# Patient Record
Sex: Female | Born: 1990 | Race: Black or African American | Hispanic: No | Marital: Single | Smoking: Never smoker
Health system: Southern US, Community
[De-identification: ages and names within clinical notes are randomized; demographics above are authoritative.]

## PROBLEM LIST (undated history)

## (undated) ENCOUNTER — Inpatient Hospital Stay (HOSPITAL_COMMUNITY): Payer: Self-pay

## (undated) DIAGNOSIS — F419 Anxiety disorder, unspecified: Secondary | ICD-10-CM

## (undated) DIAGNOSIS — Z789 Other specified health status: Secondary | ICD-10-CM

## (undated) DIAGNOSIS — J302 Other seasonal allergic rhinitis: Secondary | ICD-10-CM

## (undated) HISTORY — PX: NO PAST SURGERIES: SHX2092

## (undated) HISTORY — PX: CLEFT PALATE REPAIR: SUR1165

---

## 2013-08-10 ENCOUNTER — Emergency Department (INDEPENDENT_AMBULATORY_CARE_PROVIDER_SITE_OTHER)
Admission: EM | Admit: 2013-08-10 | Discharge: 2013-08-10 | Disposition: A | Discharge status: Home / Self Care | Payer: Self-pay | Source: Home / Self Care

## 2013-08-10 ENCOUNTER — Encounter (HOSPITAL_COMMUNITY): Payer: Self-pay | Admitting: Emergency Medicine

## 2013-08-10 DIAGNOSIS — R110 Nausea: Secondary | ICD-10-CM

## 2013-08-10 LAB — POCT PREGNANCY, URINE: PREG TEST UR: NEGATIVE

## 2013-08-10 MED ORDER — ONDANSETRON HCL 4 MG PO TABS: 4.0000 mg | ORAL_TABLET | Freq: Three times a day (TID) | ORAL | Status: DC | PRN

## 2013-08-10 MED ORDER — OMEPRAZOLE 20 MG PO CPDR: 20.0000 mg | DELAYED_RELEASE_CAPSULE | Freq: Every day | ORAL | Status: DC

## 2013-08-10 NOTE — ED Provider Notes (Signed)
CSN: [id number]     Arrival date & time 08/10/13  1915 History   First MD Initiated Contact with Patient 08/10/13 2111     Chief Complaint  Patient presents with  . Nausea     (Consider location/radiation/quality/duration/timing/severity/associated sxs/prior Treatment) HPI Comments: 23 year old female presents with nausea for approximately 2 months. It occurs nearly on a daily basis. She also has some associated belching. She is contributing to his symptoms to the Implanon for which he had implanted in 2012. Shortly after this implantation she had mild transient nausea however in December of 2013 it has worsened. She denies associated fever, vomiting or abdominal pain. She's been having normal bowel movements. Has not seen any bleeding.   History reviewed. No pertinent past medical history. History reviewed. No pertinent past surgical history. History reviewed. No pertinent family history. History  Substance Use Topics  . Smoking status: Never Smoker   . Smokeless tobacco: Not on file  . Alcohol Use: Yes     Comment: occasional   OB History   Grav Para Term Preterm Abortions TAB SAB Ect Mult Living                 Review of Systems  Constitutional: Positive for appetite change. Negative for fever, fatigue and unexpected weight change.  HENT: Negative.   Respiratory: Negative.   Cardiovascular: Negative.   Gastrointestinal: Positive for nausea. Negative for vomiting and abdominal pain.  Genitourinary: Negative.  Negative for urgency, vaginal discharge, menstrual problem and pelvic pain.  Musculoskeletal: Negative.   Neurological: Negative.       Allergies  Review of patient's allergies indicates no known allergies.  Home Medications   Current Outpatient Rx  Name  Route  Sig  Dispense  Refill  . etonogestrel (IMPLANON) 68 MG IMPL implant   Subcutaneous   Inject 1 each into the skin once.         Marland Kitchen. omeprazole (PRILOSEC) 20 MG capsule   Oral   Take 1 capsule (20 mg  total) by mouth daily.   30 capsule   0   . ondansetron (ZOFRAN) 4 MG tablet   Oral   Take 1 tablet (4 mg total) by mouth every 8 (eight) hours as needed for nausea. May cause constipation   20 tablet   0    BP 104/59  Pulse 84  Temp(Src) 99 F (37.2 C) (Oral)  Resp 14  SpO2 100%  LMP 08/05/2013 Physical Exam  Nursing note and vitals reviewed. Constitutional: She is oriented to person, place, and time. She appears well-developed and well-nourished. No distress.  Neck: Normal range of motion.  Cardiovascular: Normal rate and regular rhythm.   Pulmonary/Chest: Effort normal and breath sounds normal. No respiratory distress. She has no wheezes.  Abdominal: Soft. Bowel sounds are normal. She exhibits no distension. There is no tenderness. There is no rebound and no guarding.  Musculoskeletal: She exhibits no edema.  Neurological: She is alert and oriented to person, place, and time. She exhibits normal muscle tone.  Skin: Skin is warm and dry.  Psychiatric: She has a normal mood and affect.    ED Course  Procedures (including critical care time) Labs Review Labs Reviewed  POCT PREGNANCY, URINE   Imaging Review No results found.    Results for orders placed during the hospital encounter of 08/10/13  POCT PREGNANCY, URINE      Result Value Range   Preg Test, Ur NEGATIVE  NEGATIVE     MDM   Final  diagnoses:  Chronic nausea    Nausea with uncertain etiology. It is possible that she may have nausea due to the Implanon however more likely due to acid reflux or other etiology We'll treat with omeprazole 20 mg daily and Zofran every 8 hours when necessary nausea or vomiting Stop eating any greasy salty meals, no spicy or acidic foods. Eat small amounts more frequently than large meals. In having bleeding, vomiting, unable to hold liquids today on or other problems will need to see a gastroenterologist.    Hayden Rasmussen, NP 08/10/13 2209

## 2013-08-10 NOTE — ED Notes (Signed)
C/o nausea off and on since December.  She is on Implanon since 2012.  She wakes up at night with nausea and after eating.  The smell of chicken makes it worse.  No vomiting.

## 2013-08-10 NOTE — Discharge Instructions (Signed)
Nausea, Adult Nausea is the feeling that you have an upset stomach or have to vomit. Nausea by itself is not likely a serious concern, but it may be an early sign of more serious medical problems. As nausea gets worse, it can lead to vomiting. If vomiting develops, there is the risk of dehydration.  CAUSES   Viral infections.  Food poisoning.  Medicines.  Pregnancy.  Motion sickness.  Migraine headaches.  Emotional distress.  Severe pain from any source.  Alcohol intoxication. HOME CARE INSTRUCTIONS  Get plenty of rest.  Ask your caregiver about specific rehydration instructions.  Eat small amounts of food and sip liquids more often.  Take all medicines as told by your caregiver. SEEK MEDICAL CARE IF:  You have not improved after 2 days, or you get worse.  You have a headache. SEEK IMMEDIATE MEDICAL CARE IF:   You have a fever.  You faint.  You keep vomiting or have blood in your vomit.  You are extremely weak or dehydrated.  You have dark or bloody stools.  You have severe chest or abdominal pain. MAKE SURE YOU:  Understand these instructions.  Will watch your condition.  Will get help right away if you are not doing well or get worse. Document Released: 07/26/2004 Document Revised: 03/12/2012 Document Reviewed: 02/28/2011 Exit[employer] Patient Information 2014 [employer].  

## 2013-08-12 NOTE — ED Provider Notes (Signed)
Medical screening examination/treatment/procedure(s) were performed by resident physician or non-physician practitioner and as supervising physician I was immediately available for consultation/collaboration.   Morris Longenecker DOUGLAS MD.   Chadwick Reiswig D Mele Sylvester, MD 08/12/13 1957 

## 2014-04-15 ENCOUNTER — Encounter (HOSPITAL_COMMUNITY): Payer: Self-pay | Admitting: Emergency Medicine

## 2014-04-15 ENCOUNTER — Emergency Department (INDEPENDENT_AMBULATORY_CARE_PROVIDER_SITE_OTHER)
Admission: EM | Admit: 2014-04-15 | Discharge: 2014-04-15 | Disposition: A | Discharge status: Home / Self Care | Payer: Self-pay | Source: Home / Self Care | Attending: Family Medicine | Admitting: Family Medicine

## 2014-04-15 DIAGNOSIS — J029 Acute pharyngitis, unspecified: Secondary | ICD-10-CM

## 2014-04-15 DIAGNOSIS — H6500 Acute serous otitis media, unspecified ear: Secondary | ICD-10-CM

## 2014-04-15 MED ORDER — FLUTICASONE PROPIONATE 50 MCG/ACT NA SUSP: 2.0000 | Freq: Two times a day (BID) | NASAL | Status: DC

## 2014-04-15 MED ORDER — PREDNISONE 10 MG PO TABS: ORAL_TABLET | ORAL | Status: DC

## 2014-04-15 NOTE — Discharge Instructions (Signed)
Use Advil sinus as needed for symptoms   Serous Otitis Media Serous otitis media is fluid in the middle ear space. This space contains the bones for hearing and air. Air in the middle ear space helps to transmit sound.  The air gets there through the eustachian tube. This tube goes from the back of the nose (nasopharynx) to the middle ear space. It keeps the pressure in the middle ear the same as the outside world. It also helps to drain fluid from the middle ear space. CAUSES  Serous otitis media occurs when the eustachian tube gets blocked. Blockage can come from:  Ear infections.  Colds and other upper respiratory infections.  Allergies.  Irritants such as cigarette smoke.  Sudden changes in air pressure (such as descending in an airplane).  Enlarged adenoids.  A mass in the nasopharynx. During colds and upper respiratory infections, the middle ear space can become temporarily filled with fluid. This can happen after an ear infection also. Once the infection clears, the fluid will generally drain out of the ear through the eustachian tube. If it does not, then serous otitis media occurs. SIGNS AND SYMPTOMS   Hearing loss.  A feeling of fullness in the ear, without pain.  Young children may not show any symptoms but may show slight behavioral changes, such as agitation, ear pulling, or crying. DIAGNOSIS  Serous otitis media is diagnosed by an ear exam. Tests may be done to check on the movement of the eardrum. Hearing exams may also be done. TREATMENT  The fluid most often goes away without treatment. If allergy is the cause, allergy treatment may be helpful. Fluid that persists for several months may require minor surgery. A small tube is placed in the eardrum to:  Drain the fluid.  Restore the air in the middle ear space. In certain situations, antibiotic medicines are used to avoid surgery. Surgery may be done to remove enlarged adenoids (if this is the cause). HOME CARE  INSTRUCTIONS   Keep children away from tobacco smoke.  Keep all follow-up visits as directed by your health care provider. SEEK MEDICAL CARE IF:   Your hearing is not better in 3 months.  Your hearing is worse.  You have ear pain.  You have drainage from the ear.  You have dizziness.  You have serous otitis media only in one ear or have any bleeding from your nose (epistaxis).  You notice a lump on your neck. MAKE SURE YOU:  Understand these instructions.   Will watch your condition.   Will get help right away if you are not doing well or get worse.  Document Released: 09/08/2003 Document Revised: 11/02/2013 Document Reviewed: 01/13/2013 [facility] Patient Information 2015 [employer], [place]. This information is not intended to replace advice given to you by your health care provider. Make sure you discuss any questions you have with your health care provider.  I and

## 2014-04-15 NOTE — ED Notes (Signed)
Pt  Reports  Symptoms  Of  sorethroat         r  Earache           X  sev  Days          Reports  Had  Some  Chills  Earlier  As  Well

## 2014-04-15 NOTE — ED Provider Notes (Signed)
CSN: [id number]     Arrival date & time 04/15/14  1048 History   First MD Initiated Contact with Patient 04/15/14 1107     Chief Complaint  Patient presents with  . Sore Throat   (Consider location/radiation/quality/duration/timing/severity/associated sxs/prior Treatment) Patient is a 23 y.o. female presenting with pharyngitis.  Sore Throat      23 year old female presents complaining of 2 days of right ear pain and right-sided throat pain. When this first began she had mild subjective fever and body aches but that has resolved, she is just having ear pain and sore throat now. She has gargled saltwater which was very helpful. He denies any further fever, chills, NVD, cough. Symptoms are rated as mild. No recent travel or sick contacts  No past medical history on file. No past surgical history on file. History reviewed. No pertinent family history. History  Substance Use Topics  . Smoking status: Never Smoker   . Smokeless tobacco: Not on file  . Alcohol Use: Yes     Comment: occasional   OB History   Grav Para Term Preterm Abortions TAB SAB Ect Mult Living                 Review of Systems  HENT: Positive for ear pain and sore throat.   All other systems reviewed and are negative.   Allergies  Review of patient's allergies indicates no known allergies.  Home Medications   Prior to Admission medications   Medication Sig Start Date End Date Taking? Authorizing Provider  etonogestrel (IMPLANON) 68 MG IMPL implant Inject 1 each into the skin once.    Historical Provider, MD  fluticasone (FLONASE) 50 MCG/ACT nasal spray Place 2 sprays into both nostrils 2 (two) times daily. Decrease to 2 sprays/nostril daily after 5 days 04/15/14   Graylon GoodZachary H Latonga Ponder, PA-C  omeprazole (PRILOSEC) 20 MG capsule Take 1 capsule (20 mg total) by mouth daily. 08/10/13   Hayden Rasmussenavid Mabe, NP  ondansetron (ZOFRAN) 4 MG tablet Take 1 tablet (4 mg total) by mouth every 8 (eight) hours as needed for nausea. May  cause constipation 08/10/13   Hayden Rasmussenavid Mabe, NP  predniSONE (DELTASONE) 10 MG tablet 4 tabs PO QD for 4 days; 3 tabs PO QD for 3 days; 2 tabs PO QD for 2 days; 1 tab PO QD for 1 day 04/15/14   Graylon GoodZachary H Derrick Orris, PA-C   BP 128/80  Pulse 85  Temp(Src) 99 F (37.2 C) (Oral)  Resp 18  SpO2 98%  LMP 04/01/2014 Physical Exam  Nursing note and vitals reviewed. Constitutional: She is oriented to person, place, and time. Vital signs are normal. She appears well-developed and well-nourished. No distress.  HENT:  Head: Normocephalic and atraumatic.  Right Ear: External ear and ear canal normal. A middle ear effusion (serous) is present.  Left Ear: Tympanic membrane, external ear and ear canal normal.  Mouth/Throat: Uvula is midline, oropharynx is clear and moist and mucous membranes are normal.  Eyes: Conjunctivae are normal.  Neck: Normal range of motion. Neck supple.  Pulmonary/Chest: Effort normal. No respiratory distress.  Neurological: She is alert and oriented to person, place, and time. She has normal strength. Coordination normal.  Skin: Skin is warm and dry. No rash noted. She is not diaphoretic.  Psychiatric: She has a normal mood and affect. Judgment normal.    ED Course  Procedures (including critical care time) Labs Review Labs Reviewed - No data to display  Imaging Review No results found.  MDM   1. Acute serous otitis media, recurrence not specified, unspecified laterality   2. Pharyngitis    Physical exam is essentially normal, mild serous otitis media. Most likely viral infection. Treat symptomatically. Followup when necessary     Meds ordered this encounter  Medications  . predniSONE (DELTASONE) 10 MG tablet    Sig: 4 tabs PO QD for 4 days; 3 tabs PO QD for 3 days; 2 tabs PO QD for 2 days; 1 tab PO QD for 1 day    Dispense:  30 tablet    Refill:  0    Order Specific Question:  Supervising Provider    Answer:  Lorenz CoasterKELLER, DAVID C [id number]]  . fluticasone (FLONASE) 50  MCG/ACT nasal spray    Sig: Place 2 sprays into both nostrils 2 (two) times daily. Decrease to 2 sprays/nostril daily after 5 days    Dispense:  16 g    Refill:  2    Order Specific Question:  Supervising Provider    Answer:  Lorenz CoasterKELLER, DAVID C [[id number]]     Graylon GoodZachary H Chadd Tollison, PA-C 04/15/14 [phone]

## 2014-05-01 NOTE — ED Provider Notes (Signed)
Medical screening examination/treatment/procedure(s) were performed by a resident physician or non-physician practitioner and as the supervising physician I was immediately available for consultation/collaboration.  Tianah Lonardo, MD Family Medicine   Alejah Aristizabal J Marrio Scribner, MD 05/01/14 0345 

## 2014-10-03 ENCOUNTER — Emergency Department (INDEPENDENT_AMBULATORY_CARE_PROVIDER_SITE_OTHER)
Admission: EM | Admit: 2014-10-03 | Discharge: 2014-10-03 | Disposition: A | Discharge status: Home / Self Care | Payer: Self-pay | Source: Home / Self Care | Attending: Emergency Medicine | Admitting: Emergency Medicine

## 2014-10-03 ENCOUNTER — Encounter (HOSPITAL_COMMUNITY): Payer: Self-pay | Admitting: Emergency Medicine

## 2014-10-03 DIAGNOSIS — J302 Other seasonal allergic rhinitis: Secondary | ICD-10-CM

## 2014-10-03 MED ORDER — CETIRIZINE HCL 10 MG PO TABS: 10.0000 mg | ORAL_TABLET | Freq: Every day | ORAL | Status: DC

## 2014-10-03 MED ORDER — FLUTICASONE PROPIONATE 50 MCG/ACT NA SUSP: 1.0000 | Freq: Every day | NASAL | Status: DC

## 2014-10-03 NOTE — Discharge Instructions (Signed)
You have allergies. Take Zyrtec 1 pill daily. Use Flonase daily. Apply Vaseline to your nostrils with a Q-tip at bedtime to prevent nosebleeds. Follow-up as needed.

## 2014-10-03 NOTE — ED Provider Notes (Signed)
CSN: [id number]     Arrival date & time 10/03/14  1426 History   First MD Initiated Contact with Patient 10/03/14 1519     Chief Complaint  Patient presents with  . Allergies   (Consider location/radiation/quality/duration/timing/severity/associated sxs/prior Treatment) HPI  She is a 24 year old woman here for seasonal allergies. She states her allergies have been bothering her for the last week or so. She reports itchy watery eyes, nasal congestion, sneezing, coughing, itchy ears, itchy throat. She has tried Benadryl and Visine drops without much improvement. No fevers or chills.  History reviewed. No pertinent past medical history. History reviewed. No pertinent past surgical history. History reviewed. No pertinent family history. History  Substance Use Topics  . Smoking status: Never Smoker   . Smokeless tobacco: Not on file  . Alcohol Use: Yes     Comment: occasional   OB History    No data available     Review of Systems  Constitutional: Negative for fever, chills and appetite change.  HENT: Positive for congestion, ear pain (itching), rhinorrhea, sneezing and sore throat (Itching).   Respiratory: Positive for cough.     Allergies  Review of patient's allergies indicates no known allergies.  Home Medications   Prior to Admission medications   Medication Sig Start Date End Date Taking? Authorizing Provider  cetirizine (ZYRTEC) 10 MG tablet Take 1 tablet (10 mg total) by mouth daily. 10/03/14   Charm RingsErin J Ayliana Casciano, MD  etonogestrel (IMPLANON) 68 MG IMPL implant Inject 1 each into the skin once.    Historical Provider, MD  fluticasone (FLONASE) 50 MCG/ACT nasal spray Place 1 spray into both nostrils daily. 10/03/14   Charm RingsErin J Jhonatan Lomeli, MD  omeprazole (PRILOSEC) 20 MG capsule Take 1 capsule (20 mg total) by mouth daily. 08/10/13   Hayden Rasmussenavid Mabe, NP  ondansetron (ZOFRAN) 4 MG tablet Take 1 tablet (4 mg total) by mouth every 8 (eight) hours as needed for nausea. May cause constipation 08/10/13    Hayden Rasmussenavid Mabe, NP  predniSONE (DELTASONE) 10 MG tablet 4 tabs PO QD for 4 days; 3 tabs PO QD for 3 days; 2 tabs PO QD for 2 days; 1 tab PO QD for 1 day 04/15/14   Graylon GoodZachary H Baker, PA-C   BP 112/73 mmHg  Pulse 61  Temp(Src) 97.6 F (36.4 C) (Oral)  Resp 16  SpO2 100%  LMP 09/27/2014 (Exact Date) Physical Exam  Constitutional: She is oriented to person, place, and time. She appears well-developed and well-nourished. No distress.  HENT:  Nose: Rhinorrhea present.  Moderate cobblestoning  Eyes: Conjunctivae are normal.  Neck: Neck supple.  Cardiovascular: Normal rate, regular rhythm and normal heart sounds.   No murmur heard. Pulmonary/Chest: Effort normal and breath sounds normal. No respiratory distress. She has no wheezes. She has no rales.  Lymphadenopathy:    She has no cervical adenopathy.  Neurological: She is alert and oriented to person, place, and time.    ED Course  Procedures (including critical care time) Labs Review Labs Reviewed - No data to display  Imaging Review No results found.   MDM   1. Seasonal allergies    We'll treat with Zyrtec and Flonase. Follow-up as needed.    Charm RingsErin J Deyna Carbon, MD 10/03/14 [phone]

## 2014-10-03 NOTE — ED Notes (Signed)
C/o seasonal allergies States eyes, ears and throat is itchy Benadryl and visine was used as tx but did not help

## 2014-11-02 ENCOUNTER — Emergency Department (HOSPITAL_COMMUNITY)
Admission: EM | Admit: 2014-11-02 | Discharge: 2014-11-02 | Disposition: A | Discharge status: Home / Self Care | Payer: BLUE CROSS/BLUE SHIELD | Attending: Emergency Medicine | Admitting: Emergency Medicine

## 2014-11-02 ENCOUNTER — Encounter (HOSPITAL_COMMUNITY): Payer: Self-pay

## 2014-11-02 DIAGNOSIS — R1110 Vomiting, unspecified: Secondary | ICD-10-CM | POA: Diagnosis present

## 2014-11-02 DIAGNOSIS — Z7951 Long term (current) use of inhaled steroids: Secondary | ICD-10-CM | POA: Insufficient documentation

## 2014-11-02 DIAGNOSIS — Z3202 Encounter for pregnancy test, result negative: Secondary | ICD-10-CM | POA: Insufficient documentation

## 2014-11-02 DIAGNOSIS — B349 Viral infection, unspecified: Secondary | ICD-10-CM | POA: Diagnosis not present

## 2014-11-02 LAB — COMPREHENSIVE METABOLIC PANEL
ALBUMIN: 3.9 g/dL (ref 3.5–5.0)
ALT: 18 U/L (ref 14–54)
ANION GAP: 10 (ref 5–15)
AST: 25 U/L (ref 15–41)
Alkaline Phosphatase: 69 U/L (ref 38–126)
BILIRUBIN TOTAL: 0.5 mg/dL (ref 0.3–1.2)
BUN: 7 mg/dL (ref 6–20)
CHLORIDE: 102 mmol/L (ref 101–111)
CO2: 28 mmol/L (ref 22–32)
CREATININE: 1.01 mg/dL — AB (ref 0.44–1.00)
Calcium: 9.5 mg/dL (ref 8.9–10.3)
Glucose, Bld: 95 mg/dL (ref 70–99)
POTASSIUM: 3.7 mmol/L (ref 3.5–5.1)
Sodium: 140 mmol/L (ref 135–145)
TOTAL PROTEIN: 7.6 g/dL (ref 6.5–8.1)

## 2014-11-02 LAB — CBC WITH DIFFERENTIAL/PLATELET
Basophils Absolute: 0 10*3/uL (ref 0.0–0.1)
Basophils Relative: 1 % (ref 0–1)
Eosinophils Absolute: 0.2 10*3/uL (ref 0.0–0.7)
Eosinophils Relative: 3 % (ref 0–5)
HEMATOCRIT: 41.3 % (ref 36.0–46.0)
Hemoglobin: 13.8 g/dL (ref 12.0–15.0)
Lymphocytes Relative: 40 % (ref 12–46)
Lymphs Abs: 2.3 10*3/uL (ref 0.7–4.0)
MCH: 27.8 pg (ref 26.0–34.0)
MCHC: 33.4 g/dL (ref 30.0–36.0)
MCV: 83.1 fL (ref 78.0–100.0)
MONO ABS: 0.5 10*3/uL (ref 0.1–1.0)
Monocytes Relative: 8 % (ref 3–12)
NEUTROS ABS: 2.8 10*3/uL (ref 1.7–7.7)
Neutrophils Relative %: 48 % (ref 43–77)
PLATELETS: 265 10*3/uL (ref 150–400)
RBC: 4.97 MIL/uL (ref 3.87–5.11)
RDW: 12.9 % (ref 11.5–15.5)
WBC: 5.7 10*3/uL (ref 4.0–10.5)

## 2014-11-02 LAB — URINALYSIS, ROUTINE W REFLEX MICROSCOPIC
Bilirubin Urine: NEGATIVE
GLUCOSE, UA: NEGATIVE mg/dL
HGB URINE DIPSTICK: NEGATIVE
Ketones, ur: NEGATIVE mg/dL
Leukocytes, UA: NEGATIVE
Nitrite: NEGATIVE
PROTEIN: NEGATIVE mg/dL
Specific Gravity, Urine: 1.015 (ref 1.005–1.030)
Urobilinogen, UA: 0.2 mg/dL (ref 0.0–1.0)
pH: 6 (ref 5.0–8.0)

## 2014-11-02 LAB — POC URINE PREG, ED: Preg Test, Ur: NEGATIVE

## 2014-11-02 LAB — LIPASE, BLOOD: Lipase: 27 U/L (ref 22–51)

## 2014-11-02 MED ORDER — ONDANSETRON HCL 4 MG PO TABS: 4.0000 mg | ORAL_TABLET | Freq: Three times a day (TID) | ORAL | Status: DC | PRN

## 2014-11-02 MED ORDER — ONDANSETRON 4 MG PO TBDP
8.0000 mg | ORAL_TABLET | Freq: Once | ORAL | Status: AC
  Administered 2014-11-02: 8 mg via ORAL
  Filled 2014-11-02: qty 2

## 2014-11-02 NOTE — Discharge Instructions (Signed)

## 2014-11-02 NOTE — ED Notes (Signed)
Pt here for vomiting waking her up from sleep, pt denies pain or other symptoms

## 2014-11-02 NOTE — ED Provider Notes (Signed)
CSN: [id number]     Arrival date & time 11/02/14  0418 History   First MD Initiated Contact with Patient 11/02/14 [phone]     Chief Complaint  Patient presents with  . Emesis     (Consider location/radiation/quality/duration/timing/severity/associated sxs/prior Treatment) Patient is a 24 y.o. female presenting with vomiting. The history is provided by the patient. No language interpreter was used.  Emesis Severity:  Moderate Duration:  1 day Timing:  Constant Number of daily episodes:  1 Able to tolerate:  Liquids Progression:  Worsening Chronicity:  New Recent urination:  Normal Relieved by:  Nothing Worsened by:  Nothing tried Ineffective treatments:  None tried Associated symptoms: no abdominal pain   Risk factors: no alcohol use and not pregnant now     History reviewed. No pertinent past medical history. History reviewed. No pertinent past surgical history. History reviewed. No pertinent family history. History  Substance Use Topics  . Smoking status: Never Smoker   . Smokeless tobacco: Not on file  . Alcohol Use: Yes     Comment: occasional   OB History    No data available     Review of Systems  Gastrointestinal: Positive for vomiting. Negative for abdominal pain.  All other systems reviewed and are negative.     Allergies  Review of patient's allergies indicates no known allergies.  Home Medications   Prior to Admission medications   Medication Sig Start Date End Date Taking? Authorizing Provider  cetirizine (ZYRTEC) 10 MG tablet Take 1 tablet (10 mg total) by mouth daily. Patient taking differently: Take 10 mg by mouth daily as needed for allergies.  10/03/14  Yes Charm Rings, MD  fluticasone (FLONASE) 50 MCG/ACT nasal spray Place 1 spray into both nostrils daily. 10/03/14  Yes Charm Rings, MD  omeprazole (PRILOSEC) 20 MG capsule Take 1 capsule (20 mg total) by mouth daily. Patient not taking: Reported on 11/02/2014 08/10/13   Hayden Rasmussen, NP  ondansetron  (ZOFRAN) 4 MG tablet Take 1 tablet (4 mg total) by mouth every 8 (eight) hours as needed for nausea. May cause constipation Patient not taking: Reported on 11/02/2014 08/10/13   Hayden Rasmussen, NP  predniSONE (DELTASONE) 10 MG tablet 4 tabs PO QD for 4 days; 3 tabs PO QD for 3 days; 2 tabs PO QD for 2 days; 1 tab PO QD for 1 day Patient not taking: Reported on 11/02/2014 04/15/14   Graylon Good, PA-C   BP 106/61 mmHg  Pulse 70  Temp(Src) 98 F (36.7 C) (Oral)  Resp 18  Ht  (1.803 m)  Wt 170 lb (77.111 kg)  BMI 23.72 kg/m2  SpO2 100%  LMP 09/27/2014 (Exact Date) Physical Exam  Constitutional: She is oriented to person, place, and time. She appears well-developed and well-nourished.  HENT:  Head: Normocephalic and atraumatic.  Eyes: Conjunctivae and EOM are normal. Pupils are equal, round, and reactive to light.  Neck: Normal range of motion. Neck supple.  Cardiovascular: Normal rate.   Pulmonary/Chest: Effort normal.  Abdominal: Soft. There is no tenderness.  Musculoskeletal: Normal range of motion.  Neurological: She is alert and oriented to person, place, and time.  Skin: Skin is warm.  Psychiatric: She has a normal mood and affect.  Nursing note and vitals reviewed.   ED Course  Procedures (including critical care time) Labs Review Labs Reviewed  COMPREHENSIVE METABOLIC PANEL - Abnormal; Notable for the following:    Creatinine, Ser 1.01 (*)    All other components  within normal limits  CBC WITH DIFFERENTIAL/PLATELET  LIPASE, BLOOD  URINALYSIS, ROUTINE W REFLEX MICROSCOPIC  POC URINE PREG, ED    Imaging Review No results found.   EKG Interpretation None      Results for orders placed or performed during the hospital encounter of 11/02/14  CBC with Differential  Result Value Ref Range   WBC 5.7 4.0 - 10.5 K/uL   RBC 4.97 3.87 - 5.11 MIL/uL   Hemoglobin 13.8 12.0 - 15.0 g/dL   HCT 29.541.3 62.136.0 - 30.846.0 %   MCV 83.1 78.0 - 100.0 fL   MCH 27.8 26.0 - 34.0 pg   MCHC  33.4 30.0 - 36.0 g/dL   RDW 65.712.9 84.611.5 - 96.215.5 %   Platelets 265 150 - 400 K/uL   Neutrophils Relative % 48 43 - 77 %   Neutro Abs 2.8 1.7 - 7.7 K/uL   Lymphocytes Relative 40 12 - 46 %   Lymphs Abs 2.3 0.7 - 4.0 K/uL   Monocytes Relative 8 3 - 12 %   Monocytes Absolute 0.5 0.1 - 1.0 K/uL   Eosinophils Relative 3 0 - 5 %   Eosinophils Absolute 0.2 0.0 - 0.7 K/uL   Basophils Relative 1 0 - 1 %   Basophils Absolute 0.0 0.0 - 0.1 K/uL  Comprehensive metabolic panel  Result Value Ref Range   Sodium 140 135 - 145 mmol/L   Potassium 3.7 3.5 - 5.1 mmol/L   Chloride 102 101 - 111 mmol/L   CO2 28 22 - 32 mmol/L   Glucose, Bld 95 70 - 99 mg/dL   BUN 7 6 - 20 mg/dL   Creatinine, Ser 9.521.01 (H) 0.44 - 1.00 mg/dL   Calcium 9.5 8.9 - 84.110.3 mg/dL   Total Protein 7.6 6.5 - 8.1 g/dL   Albumin 3.9 3.5 - 5.0 g/dL   AST 25 15 - 41 U/L   ALT 18 14 - 54 U/L   Alkaline Phosphatase 69 38 - 126 U/L   Total Bilirubin 0.5 0.3 - 1.2 mg/dL   GFR calc non Af Amer >60 >60 mL/min   GFR calc Af Amer >60 >60 mL/min   Anion gap 10 5 - 15  Lipase, blood  Result Value Ref Range   Lipase 27 22 - 51 U/L  Urinalysis, Routine w reflex microscopic  Result Value Ref Range   Color, Urine YELLOW YELLOW   APPearance CLEAR CLEAR   Specific Gravity, Urine 1.015 1.005 - 1.030   pH 6.0 5.0 - 8.0   Glucose, UA NEGATIVE NEGATIVE mg/dL   Hgb urine dipstick NEGATIVE NEGATIVE   Bilirubin Urine NEGATIVE NEGATIVE   Ketones, ur NEGATIVE NEGATIVE mg/dL   Protein, ur NEGATIVE NEGATIVE mg/dL   Urobilinogen, UA 0.2 0.0 - 1.0 mg/dL   Nitrite NEGATIVE NEGATIVE   Leukocytes, UA NEGATIVE NEGATIVE  POC Urine Pregnancy, ED  (If Pre-menopausal female)  [facility]  Result Value Ref Range   Preg Test, Ur NEGATIVE NEGATIVE   No results found.   MDM  Pt given zofran by RN.  Pt feels much better.  Pt given rx for zofran   Final diagnoses:  Viral illness    rx zofran avs    Elson AreasLeslie K Shantal Roan, PA-C 11/02/14 [id number]  Zadie Rhineonald Wickline,  MD 11/03/14 [phone]

## 2014-11-13 ENCOUNTER — Encounter (HOSPITAL_COMMUNITY): Payer: Self-pay | Admitting: *Deleted

## 2014-11-13 ENCOUNTER — Inpatient Hospital Stay (HOSPITAL_COMMUNITY)
Admission: AD | Admit: 2014-11-13 | Discharge: 2014-11-13 | Disposition: A | Discharge status: Home / Self Care | Payer: BLUE CROSS/BLUE SHIELD | Source: Ambulatory Visit | Attending: Obstetrics & Gynecology | Admitting: Obstetrics & Gynecology

## 2014-11-13 DIAGNOSIS — R109 Unspecified abdominal pain: Secondary | ICD-10-CM | POA: Diagnosis present

## 2014-11-13 DIAGNOSIS — N760 Acute vaginitis: Secondary | ICD-10-CM | POA: Diagnosis not present

## 2014-11-13 DIAGNOSIS — A499 Bacterial infection, unspecified: Secondary | ICD-10-CM | POA: Diagnosis not present

## 2014-11-13 DIAGNOSIS — K219 Gastro-esophageal reflux disease without esophagitis: Secondary | ICD-10-CM | POA: Diagnosis not present

## 2014-11-13 DIAGNOSIS — B9689 Other specified bacterial agents as the cause of diseases classified elsewhere: Secondary | ICD-10-CM

## 2014-11-13 LAB — WET PREP, GENITAL
Trich, Wet Prep: NONE SEEN
Yeast Wet Prep HPF POC: NONE SEEN

## 2014-11-13 LAB — URINALYSIS, ROUTINE W REFLEX MICROSCOPIC
Bilirubin Urine: NEGATIVE
Glucose, UA: NEGATIVE mg/dL
Hgb urine dipstick: NEGATIVE
Ketones, ur: NEGATIVE mg/dL
LEUKOCYTES UA: NEGATIVE
NITRITE: NEGATIVE
PH: 6 (ref 5.0–8.0)
PROTEIN: NEGATIVE mg/dL
SPECIFIC GRAVITY, URINE: 1.02 (ref 1.005–1.030)
UROBILINOGEN UA: 0.2 mg/dL (ref 0.0–1.0)

## 2014-11-13 LAB — HIV ANTIBODY (ROUTINE TESTING W REFLEX): HIV SCREEN 4TH GENERATION: NONREACTIVE

## 2014-11-13 LAB — POCT PREGNANCY, URINE: Preg Test, Ur: NEGATIVE

## 2014-11-13 MED ORDER — OMEPRAZOLE 20 MG PO CPDR: 20.0000 mg | DELAYED_RELEASE_CAPSULE | Freq: Every day | ORAL | Status: DC

## 2014-11-13 MED ORDER — METRONIDAZOLE 500 MG PO TABS: 500.0000 mg | ORAL_TABLET | Freq: Two times a day (BID) | ORAL | Status: DC

## 2014-11-13 NOTE — MAU Note (Signed)
Patient states having sharp abdominal pain on and off for last two weeks.  States having occasional nausea and vomited x1.  No c/o fever or diarrhea.  States unsure if pregnant and LMP was 10/22/14.

## 2014-11-13 NOTE — MAU Provider Note (Signed)
History     CSN: [id number]  Arrival date and time: 11/13/14 [id number]   First Provider Initiated Contact with Patient 11/13/14 0140      Chief Complaint  Patient presents with  . Abdominal Pain   HPI   Sylvia Fuller is a 24 y.o. female G0P0000 who presents with abdominal pain. The abdominal pain is random, when it comes it shoots across the entire middle part of her stomach.  She currently denies pain. She has not tried anything over the counter for the pain. She feels like this has been going on for 1-2 weeks. She thinks the pain may be related to gas. She eats a lot of fried foods and fast foods and feels it may be related to her diet.    [facility] for nausea and was given Zofran; she did not fill this RX.  OB History    Gravida Para Term Preterm AB TAB SAB Ectopic Multiple Living   0 0 0 0 0 0 0 0 0 0       History reviewed. No pertinent past medical history.  Past Surgical History  Procedure Laterality Date  . No past surgeries      History reviewed. No pertinent family history.  History  Substance Use Topics  . Smoking status: Never Smoker   . Smokeless tobacco: Not on file  . Alcohol Use: No     Comment: occasional    Allergies: No Known Allergies  Prescriptions prior to admission  Medication Sig Dispense Refill Last Dose  . cetirizine (ZYRTEC) 10 MG tablet Take 1 tablet (10 mg total) by mouth daily. (Patient taking differently: Take 10 mg by mouth daily as needed for allergies. ) 30 tablet 2 unknown  . fluticasone (FLONASE) 50 MCG/ACT nasal spray Place 1 spray into both nostrils daily. 16 g 2 unknown  . omeprazole (PRILOSEC) 20 MG capsule Take 1 capsule (20 mg total) by mouth daily. (Patient not taking: Reported on 11/02/2014) 30 capsule 0 Completed Course at Unknown time  . ondansetron (ZOFRAN) 4 MG tablet Take 1 tablet (4 mg total) by mouth every 8 (eight) hours as needed for nausea. May cause constipation 20 tablet 0   .  predniSONE (DELTASONE) 10 MG tablet 4 tabs PO QD for 4 days; 3 tabs PO QD for 3 days; 2 tabs PO QD for 2 days; 1 tab PO QD for 1 day (Patient not taking: Reported on 11/02/2014) 30 tablet 0 Completed Course at Unknown time   Results for orders placed or performed during the hospital encounter of 11/13/14 (from the past 48 hour(s))  Urinalysis, Routine w reflex microscopic     Status: None   Collection Time: 11/13/14  1:20 AM  Result Value Ref Range   Color, Urine YELLOW YELLOW   APPearance CLEAR CLEAR   Specific Gravity, Urine 1.020 1.005 - 1.030   pH 6.0 5.0 - 8.0   Glucose, UA NEGATIVE NEGATIVE mg/dL   Hgb urine dipstick NEGATIVE NEGATIVE   Bilirubin Urine NEGATIVE NEGATIVE   Ketones, ur NEGATIVE NEGATIVE mg/dL   Protein, ur NEGATIVE NEGATIVE mg/dL   Urobilinogen, UA 0.2 0.0 - 1.0 mg/dL   Nitrite NEGATIVE NEGATIVE   Leukocytes, UA NEGATIVE NEGATIVE    Comment: MICROSCOPIC NOT DONE ON URINES WITH NEGATIVE PROTEIN, BLOOD, LEUKOCYTES, NITRITE, OR GLUCOSE <1000 mg/dL.  Pregnancy, urine POC     Status: None   Collection Time: 11/13/14  1:39 AM  Result Value Ref Range  Preg Test, Ur NEGATIVE NEGATIVE    Comment:        THE SENSITIVITY OF THIS METHODOLOGY IS >24 mIU/mL   Wet prep, genital     Status: Abnormal   Collection Time: 11/13/14  1:50 AM  Result Value Ref Range   Yeast Wet Prep HPF POC NONE SEEN NONE SEEN   Trich, Wet Prep NONE SEEN NONE SEEN   Clue Cells Wet Prep HPF POC MODERATE (A) NONE SEEN   WBC, Wet Prep HPF POC FEW (A) NONE SEEN    Comment: MANY BACTERIA SEEN    Review of Systems  Constitutional: Negative for fever and chills.  Gastrointestinal: Positive for heartburn and nausea. Negative for vomiting, abdominal pain, diarrhea and constipation.  Genitourinary: Negative for dysuria and urgency.   Physical Exam   Blood pressure 127/66, pulse 77, temperature 98.2 F (36.8 C), temperature source Oral, resp. rate 16, weight 81.012 kg (178 lb 9.6 oz), last menstrual  period 10/22/2014, SpO2 99 %.  Physical Exam  Constitutional: She is oriented to person, place, and time. She appears well-developed and well-nourished. No distress.  HENT:  Head: Normocephalic.  Eyes: Pupils are equal, round, and reactive to light.  Neck: Neck supple.  Respiratory: Effort normal.  GI: Soft. She exhibits no distension and no mass. There is no tenderness. There is no rebound and no guarding.  Genitourinary:  Wet prep and GC swabs collected by NP without speculum   Neurological: She is alert and oriented to person, place, and time.  Skin: Skin is warm. She is not diaphoretic.  Psychiatric: Her behavior is normal.    MAU Course  Procedures  None  MDM  Pain 0/10 at this time GC pending   Assessment and Plan   A:  1. BV (bacterial vaginosis)   2. Gastroesophageal reflux disease, esophagitis presence not specified    P:  Discharge home in stable condition RX: Zantac, Flagyl - no alcohol Follow up with PCP if symptoms do not improve with treatment Diet discussed, avoid fried/ fast foods    Duane LopeJennifer I Rasch, NP 11/13/2014 1:45 AM

## 2014-11-15 LAB — GC/CHLAMYDIA PROBE AMP (~~LOC~~) NOT AT ARMC
Chlamydia: NEGATIVE
Neisseria Gonorrhea: NEGATIVE

## 2014-11-22 ENCOUNTER — Emergency Department (INDEPENDENT_AMBULATORY_CARE_PROVIDER_SITE_OTHER)
Admission: EM | Admit: 2014-11-22 | Discharge: 2014-11-22 | Disposition: A | Discharge status: Home / Self Care | Payer: BLUE CROSS/BLUE SHIELD | Source: Home / Self Care | Attending: Internal Medicine | Admitting: Internal Medicine

## 2014-11-22 ENCOUNTER — Encounter (HOSPITAL_COMMUNITY): Payer: Self-pay | Admitting: Emergency Medicine

## 2014-11-22 DIAGNOSIS — Z3201 Encounter for pregnancy test, result positive: Secondary | ICD-10-CM | POA: Diagnosis not present

## 2014-11-22 DIAGNOSIS — Z3490 Encounter for supervision of normal pregnancy, unspecified, unspecified trimester: Secondary | ICD-10-CM

## 2014-11-22 LAB — POCT URINALYSIS DIP (DEVICE)
BILIRUBIN URINE: NEGATIVE
Glucose, UA: NEGATIVE mg/dL
Ketones, ur: NEGATIVE mg/dL
LEUKOCYTES UA: NEGATIVE
Nitrite: NEGATIVE
Protein, ur: NEGATIVE mg/dL
SPECIFIC GRAVITY, URINE: 1.025 (ref 1.005–1.030)
UROBILINOGEN UA: 0.2 mg/dL (ref 0.0–1.0)
pH: 6 (ref 5.0–8.0)

## 2014-11-22 LAB — POCT PREGNANCY, URINE: PREG TEST UR: POSITIVE — AB

## 2014-11-22 MED ORDER — METRONIDAZOLE 0.75 % VA GEL: 1.0000 | Freq: Two times a day (BID) | VAGINAL | Status: DC

## 2014-11-22 NOTE — Discharge Instructions (Signed)
Make appointment with Ob/GYN to establish prenatal care.    First Trimester of Pregnancy The first trimester of pregnancy is from week 1 until the end of week 12 (months 1 through 3). During this time, your baby will begin to develop inside you. At 6-8 weeks, the eyes and face are formed, and the heartbeat can be seen on ultrasound. At the end of 12 weeks, all the baby's organs are formed. Prenatal care is all the medical care you receive before the birth of your baby. Make sure you get good prenatal care and follow all of your doctor's instructions. HOME CARE  Medicines  Take medicine only as told by your doctor. Some medicines are safe and some are not during pregnancy.  Take your prenatal vitamins as told by your doctor.  Take medicine that helps you poop (stool softener) as needed if your doctor says it is okay. Diet  Eat regular, healthy meals.  Your doctor will tell you the amount of weight gain that is right for you.  Avoid raw meat and uncooked cheese.  If you feel sick to your stomach (nauseous) or throw up (vomit):  Eat 4 or 5 small meals a day instead of 3 large meals.  Try eating a few soda crackers.  Drink liquids between meals instead of during meals.  If you have a hard time pooping (constipation):  Eat high-fiber foods like fresh vegetables, fruit, and whole grains.  Drink enough fluids to keep your pee (urine) clear or pale yellow. Activity and Exercise  Exercise only as told by your doctor. Stop exercising if you have cramps or pain in your lower belly (abdomen) or low back.  Try to avoid standing for long periods of time. Move your legs often if you must stand in one place for a long time.  Avoid heavy lifting.  Wear low-heeled shoes. Sit and stand up straight.  You can have sex unless your doctor tells you not to. Relief of Pain or Discomfort  Wear a good support bra if your breasts are sore.  Take warm water baths (sitz baths) to soothe pain or  discomfort caused by hemorrhoids. Use hemorrhoid cream if your doctor says it is okay.  Rest with your legs raised if you have leg cramps or low back pain.  Wear support hose if you have puffy, bulging veins (varicose veins) in your legs. Raise (elevate) your feet for 15 minutes, 3-4 times a day. Limit salt in your diet. Prenatal Care  Schedule your prenatal visits by the twelfth week of pregnancy.  Write down your questions. Take them to your prenatal visits.  Keep all your prenatal visits as told by your doctor. Safety  Wear your seat belt at all times when driving.  Make a list of emergency phone numbers. The list should include numbers for family, friends, the hospital, and police and fire departments. General Tips  Ask your doctor for a referral to a local prenatal class. Begin classes no later than at the start of month 6 of your pregnancy.  Ask for help if you need counseling or help with nutrition. Your doctor can give you advice or tell you where to go for help.  Do not use hot tubs, steam rooms, or saunas.  Do not douche or use tampons or scented sanitary pads.  Do not cross your legs for long periods of time.  Avoid litter boxes and soil used by cats.  Avoid all smoking, herbs, and alcohol. Avoid drugs not approved by  your doctor.  Visit your dentist. At home, brush your teeth with a soft toothbrush. Be gentle when you floss. GET HELP IF:  You are dizzy.  You have mild cramps or pressure in your lower belly.  You have a nagging pain in your belly area.  You continue to feel sick to your stomach, throw up, or have watery poop (diarrhea).  You have a bad smelling fluid coming from your vagina.  You have pain with peeing (urination).  You have increased puffiness (swelling) in your face, hands, legs, or ankles. GET HELP RIGHT AWAY IF:   You have a fever.  You are leaking fluid from your vagina.  You have spotting or bleeding from your vagina.  You have  very bad belly cramping or pain.  You gain or lose weight rapidly.  You throw up blood. It may look like coffee grounds.  You are around people who have [place] measles, fifth disease, or chickenpox.  You have a very bad headache.  You have shortness of breath.  You have any kind of trauma, such as from a fall or a car accident. Document Released: 12/05/2007 Document Revised: 11/02/2013 Document Reviewed: 04/28/2013 [facility] Patient Information 2015 [place], [place]. This information is not intended to replace advice given to you by your health care provider. Make sure you discuss any questions you have with your health care provider.

## 2014-11-22 NOTE — ED Notes (Signed)
Pt states that she has taken 2 home pregnancy and they were positive. Pt is here to confirm pregnancy

## 2014-11-23 NOTE — ED Provider Notes (Signed)
CSN: [id number]     Arrival date & time 11/22/14  1943 History   First MD Initiated Contact with Patient 11/22/14 2102     Chief Complaint  Patient presents with  . Possible Pregnancy   HPI  Patient denies abd/pelvic pain, no unusual vag bleeding, no fever.  No GI distress.  States LMP about 10/26/14.  History reviewed. No pertinent past medical history. Past Surgical History  Procedure Laterality Date  . No past surgeries     History reviewed. No pertinent family history. History  Substance Use Topics  . Smoking status: Never Smoker   . Smokeless tobacco: Not on file  . Alcohol Use: No     Comment: occasional   OB History    Gravida Para Term Preterm AB TAB SAB Ectopic Multiple Living   1 0 0 0 0 0 0 0 0 0      Review of Systems  All other systems reviewed and are negative.   Allergies  Review of patient's allergies indicates no known allergies.  Home Medications   Prior to Admission medications   Medication Sig Start Date End Date Taking? Authorizing Provider  metroNIDAZOLE (FLAGYL) 500 MG tablet Take 1 tablet (500 mg total) by mouth 2 (two) times daily. 11/13/14   Duane LopeJennifer I Rasch, NP         omeprazole (PRILOSEC) 20 MG capsule Take 1 capsule (20 mg total) by mouth daily. 11/13/14   Harolyn RutherfordJennifer I Rasch, NP  ondansetron (ZOFRAN) 4 MG tablet Take 1 tablet (4 mg total) by mouth every 8 (eight) hours as needed for nausea. May cause constipation 11/02/14   Elson AreasLeslie K Sofia, PA-C   BP 121/71 mmHg  Pulse 72  Temp(Src) 97.9 F (36.6 C) (Oral)  Resp 18  SpO2 96%  LMP 10/22/2014 (Exact Date) Physical Exam  Constitutional: She is oriented to person, place, and time. No distress.  Alert, nicely groomed  HENT:  Head: Atraumatic.  Eyes:  Conjugate gaze, no eye redness/drainage  Neck: Neck supple.  Cardiovascular: Normal rate.   Pulmonary/Chest: No respiratory distress.  Abdominal: She exhibits no distension.  Musculoskeletal: Normal range of motion.  No leg swelling   Neurological: She is alert and oriented to person, place, and time.  Skin: Skin is warm and dry.  No cyanosis  Nursing note and vitals reviewed.   ED Course  Procedures (including critical care time) Labs Review Labs Reviewed  POCT URINALYSIS DIP (DEVICE) - Abnormal; Notable for the following:    Hgb urine dipstick TRACE (*)    All other components within normal limits  POCT PREGNANCY, URINE - Abnormal; Notable for the following:    Preg Test, Ur POSITIVE (*)    All other components within normal limits    Imaging Review No results found.   MDM   1. Pregnancy at early stage   refer for prenatal care [facility] Changed po flagyl to intravaginal flagyl, for recent dx of bacterial vaginosis.    Eustace MooreLaura W Arrin Ishler, MD 11/23/14 [phone]

## 2014-11-23 NOTE — ED Notes (Signed)
Pt   Called     Stating   She  Needed  A  referrell    t  [facility] they  Required   nots  From  Dr   -  Josefina DoEpic     Checked   unable  To  Locate  The  H  And  P   The  Provider  Notified  And  Will document H AND  p  MAGGIE    WILL COORDINATE  APPT  THIS  WEEK

## 2014-12-03 ENCOUNTER — Encounter (HOSPITAL_COMMUNITY): Payer: Self-pay

## 2014-12-03 ENCOUNTER — Inpatient Hospital Stay (HOSPITAL_COMMUNITY)
Admission: AD | Admit: 2014-12-03 | Discharge: 2014-12-03 | Disposition: A | Discharge status: Home / Self Care | Payer: BLUE CROSS/BLUE SHIELD | Source: Ambulatory Visit | Attending: Family Medicine | Admitting: Family Medicine

## 2014-12-03 DIAGNOSIS — O9989 Other specified diseases and conditions complicating pregnancy, childbirth and the puerperium: Secondary | ICD-10-CM | POA: Diagnosis not present

## 2014-12-03 DIAGNOSIS — Z3201 Encounter for pregnancy test, result positive: Secondary | ICD-10-CM | POA: Diagnosis not present

## 2014-12-03 NOTE — MAU Note (Signed)
Pt presents for pregnancy confirmation. Denies vaginal bleeding or pain. +HPT

## 2014-12-03 NOTE — MAU Provider Note (Signed)
  History     CSN: [id number]  Arrival date and time: 12/03/14 1355   None     Chief Complaint  Patient presents with  . Possible Pregnancy   HPI Sylvia Fuller is 24 y.o. G1P0000 4339w0d weeks presenting for confirmation letter to get insurance.  She was seen at Urgent Care recently with + UPT.  Does not know how far along she is.  By dates 3539w0d.  She denies abdominal pain and vaginal bleeding  No past medical history on file.  Past Surgical History  Procedure Laterality Date  . No past surgeries      No family history on file.  History  Substance Use Topics  . Smoking status: Never Smoker   . Smokeless tobacco: Not on file  . Alcohol Use: No     Comment: occasional    Allergies: No Known Allergies  Prescriptions prior to admission  Medication Sig Dispense Refill Last Dose  . metroNIDAZOLE (FLAGYL) 500 MG tablet Take 1 tablet (500 mg total) by mouth 2 (two) times daily. 14 tablet 0   . metroNIDAZOLE (METROGEL) 0.75 % vaginal gel Place 1 Applicatorful vaginally 2 (two) times daily. 70 g 0   . omeprazole (PRILOSEC) 20 MG capsule Take 1 capsule (20 mg total) by mouth daily. 30 capsule 1   . ondansetron (ZOFRAN) 4 MG tablet Take 1 tablet (4 mg total) by mouth every 8 (eight) hours as needed for nausea. May cause constipation 20 tablet 0     ROS Physical Exam   Blood pressure 115/66, temperature 98.1 F (36.7 C), temperature source Oral, resp. rate 18, height 5\' 11"  (1.803 m), weight 172 lb (78.019 kg), last menstrual period 10/22/2014.  Physical Exam  MAU Course  Procedures  MDM   Assessment and Plan  A:  + pregnancy test  P:  Verification letter given to patient      She plans care with private practice.  KEY,EVE M 12/03/2014, 3:15 PM

## 2014-12-03 NOTE — Discharge Instructions (Signed)
First Trimester of Pregnancy The first trimester of pregnancy is from week 1 until the end of week 12 (months 1 through 3). A week after a sperm fertilizes an egg, the egg will implant on the wall of the uterus. This embryo will begin to develop into a baby. Genes from you and your partner are forming the baby. The female genes determine whether the baby is a boy or a girl. At 6-8 weeks, the eyes and face are formed, and the heartbeat can be seen on ultrasound. At the end of 12 weeks, all the baby's organs are formed.  Now that you are pregnant, you will want to do everything you can to have a healthy baby. Two of the most important things are to get good prenatal care and to follow your health care provider's instructions. Prenatal care is all the medical care you receive before the baby's birth. This care will help prevent, find, and treat any problems during the pregnancy and childbirth. BODY CHANGES Your body goes through many changes during pregnancy. The changes vary from woman to woman.   You may gain or lose a couple of pounds at first.  You may feel sick to your stomach (nauseous) and throw up (vomit). If the vomiting is uncontrollable, call your health care provider.  You may tire easily.  You may develop headaches that can be relieved by medicines approved by your health care provider.  You may urinate more often. Painful urination may mean you have a bladder infection.  You may develop heartburn as a result of your pregnancy.  You may develop constipation because certain hormones are causing the muscles that push waste through your intestines to slow down.  You may develop hemorrhoids or swollen, bulging veins (varicose veins).  Your breasts may begin to grow larger and become tender. Your nipples may stick out more, and the tissue that surrounds them (areola) may become darker.  Your gums may bleed and may be sensitive to brushing and flossing.  Dark spots or blotches (chloasma,  mask of pregnancy) may develop on your face. This will likely fade after the baby is born.  Your menstrual periods will stop.  You may have a loss of appetite.  You may develop cravings for certain kinds of food.  You may have changes in your emotions from day to day, such as being excited to be pregnant or being concerned that something may go wrong with the pregnancy and baby.  You may have more vivid and strange dreams.  You may have changes in your hair. These can include thickening of your hair, rapid growth, and changes in texture. Some women also have hair loss during or after pregnancy, or hair that feels dry or thin. Your hair will most likely return to normal after your baby is born. WHAT TO EXPECT AT YOUR PRENATAL VISITS During a routine prenatal visit:  You will be weighed to make sure you and the baby are growing normally.  Your blood pressure will be taken.  Your abdomen will be measured to track your baby's growth.  The fetal heartbeat will be listened to starting around week 10 or 12 of your pregnancy.  Test results from any previous visits will be discussed. Your health care provider may ask you:  How you are feeling.  If you are feeling the baby move.  If you have had any abnormal symptoms, such as leaking fluid, bleeding, severe headaches, or abdominal cramping.  If you have any questions. Other tests   that may be performed during your first trimester include:  Blood tests to find your blood type and to check for the presence of any previous infections. They will also be used to check for low iron levels (anemia) and Rh antibodies. Later in the pregnancy, blood tests for diabetes will be done along with other tests if problems develop.  Urine tests to check for infections, diabetes, or protein in the urine.  An ultrasound to confirm the proper growth and development of the baby.  An amniocentesis to check for possible genetic problems.  Fetal screens for  spina bifida and Down syndrome.  You may need other tests to make sure you and the baby are doing well. HOME CARE INSTRUCTIONS  Medicines  Follow your health care provider's instructions regarding medicine use. Specific medicines may be either safe or unsafe to take during pregnancy.  Take your prenatal vitamins as directed.  If you develop constipation, try taking a stool softener if your health care provider approves. Diet  Eat regular, well-balanced meals. Choose a variety of foods, such as meat or vegetable-based protein, fish, milk and low-fat dairy products, vegetables, fruits, and whole grain breads and cereals. Your health care provider will help you determine the amount of weight gain that is right for you.  Avoid raw meat and uncooked cheese. These carry germs that can cause birth defects in the baby.  Eating four or five small meals rather than three large meals a day may help relieve nausea and vomiting. If you start to feel nauseous, eating a few soda crackers can be helpful. Drinking liquids between meals instead of during meals also seems to help nausea and vomiting.  If you develop constipation, eat more high-fiber foods, such as fresh vegetables or fruit and whole grains. Drink enough fluids to keep your urine clear or pale yellow. Activity and Exercise  Exercise only as directed by your health care provider. Exercising will help you:  Control your weight.  Stay in shape.  Be prepared for labor and delivery.  Experiencing pain or cramping in the lower abdomen or low back is a good sign that you should stop exercising. Check with your health care provider before continuing normal exercises.  Try to avoid standing for long periods of time. Move your legs often if you must stand in one place for a long time.  Avoid heavy lifting.  Wear low-heeled shoes, and practice good posture.  You may continue to have sex unless your health care provider directs you  otherwise. Relief of Pain or Discomfort  Wear a good support bra for breast tenderness.   Take warm sitz baths to soothe any pain or discomfort caused by hemorrhoids. Use hemorrhoid cream if your health care provider approves.   Rest with your legs elevated if you have leg cramps or low back pain.  If you develop varicose veins in your legs, wear support hose. Elevate your feet for 15 minutes, 3-4 times a day. Limit salt in your diet. Prenatal Care  Schedule your prenatal visits by the twelfth week of pregnancy. They are usually scheduled monthly at first, then more often in the last 2 months before delivery.  Write down your questions. Take them to your prenatal visits.  Keep all your prenatal visits as directed by your health care provider. Safety  Wear your seat belt at all times when driving.  Make a list of emergency phone numbers, including numbers for family, friends, the hospital, and police and fire departments. General Tips    Ask your health care provider for a referral to a local prenatal education class. Begin classes no later than at the beginning of month 6 of your pregnancy.  Ask for help if you have counseling or nutritional needs during pregnancy. Your health care provider can offer advice or refer you to specialists for help with various needs.  Do not use hot tubs, steam rooms, or saunas.  Do not douche or use tampons or scented sanitary pads.  Do not cross your legs for long periods of time.  Avoid cat litter boxes and soil used by cats. These carry germs that can cause birth defects in the baby and possibly loss of the fetus by miscarriage or stillbirth.  Avoid all smoking, herbs, alcohol, and medicines not prescribed by your health care provider. Chemicals in these affect the formation and growth of the baby.  Schedule a dentist appointment. At home, brush your teeth with a soft toothbrush and be gentle when you floss. SEEK MEDICAL CARE IF:   You have  dizziness.  You have mild pelvic cramps, pelvic pressure, or nagging pain in the abdominal area.  You have persistent nausea, vomiting, or diarrhea.  You have a bad smelling vaginal discharge.  You have pain with urination.  You notice increased swelling in your face, hands, legs, or ankles. SEEK IMMEDIATE MEDICAL CARE IF:   You have a fever.  You are leaking fluid from your vagina.  You have spotting or bleeding from your vagina.  You have severe abdominal cramping or pain.  You have rapid weight gain or loss.  You vomit blood or material that looks like coffee grounds.  You are exposed to [place] measles and have never had them.  You are exposed to fifth disease or chickenpox.  You develop a severe headache.  You have shortness of breath.  You have any kind of trauma, such as from a fall or a car accident. Document Released: 06/12/2001 Document Revised: 11/02/2013 Document Reviewed: 04/28/2013 [employer] Patient Information 2015 [employer]. This information is not intended to replace advice given to you by your health care provider. Make sure you discuss any questions you have with your health care provider.  

## 2015-01-14 LAB — OB RESULTS CONSOLE ABO/RH: RH Type: POSITIVE

## 2015-01-14 LAB — OB RESULTS CONSOLE HIV ANTIBODY (ROUTINE TESTING): HIV: NONREACTIVE

## 2015-01-14 LAB — OB RESULTS CONSOLE GC/CHLAMYDIA
CHLAMYDIA, DNA PROBE: NEGATIVE
Gonorrhea: NEGATIVE

## 2015-01-14 LAB — OB RESULTS CONSOLE RUBELLA ANTIBODY, IGM: Rubella: IMMUNE

## 2015-01-14 LAB — OB RESULTS CONSOLE RPR: RPR: NONREACTIVE

## 2015-01-14 LAB — OB RESULTS CONSOLE ANTIBODY SCREEN: Antibody Screen: NEGATIVE

## 2015-01-15 LAB — OB RESULTS CONSOLE HEPATITIS B SURFACE ANTIGEN: Hepatitis B Surface Ag: NEGATIVE

## 2015-01-28 ENCOUNTER — Inpatient Hospital Stay (HOSPITAL_COMMUNITY)
Admission: AD | Admit: 2015-01-28 | Discharge: 2015-01-28 | Disposition: A | Discharge status: Home / Self Care | Payer: BLUE CROSS/BLUE SHIELD | Source: Ambulatory Visit | Attending: Obstetrics and Gynecology | Admitting: Obstetrics and Gynecology

## 2015-01-28 ENCOUNTER — Encounter (HOSPITAL_COMMUNITY): Payer: Self-pay | Admitting: *Deleted

## 2015-01-28 ENCOUNTER — Emergency Department (HOSPITAL_COMMUNITY)
Admission: EM | Admit: 2015-01-28 | Discharge: 2015-01-28 | Discharge status: Absent without leave | Payer: BLUE CROSS/BLUE SHIELD | Source: Home / Self Care

## 2015-01-28 DIAGNOSIS — R748 Abnormal levels of other serum enzymes: Secondary | ICD-10-CM | POA: Diagnosis not present

## 2015-01-28 DIAGNOSIS — Z3A14 14 weeks gestation of pregnancy: Secondary | ICD-10-CM | POA: Insufficient documentation

## 2015-01-28 DIAGNOSIS — O9989 Other specified diseases and conditions complicating pregnancy, childbirth and the puerperium: Secondary | ICD-10-CM | POA: Diagnosis not present

## 2015-01-28 DIAGNOSIS — R1010 Upper abdominal pain, unspecified: Secondary | ICD-10-CM | POA: Diagnosis present

## 2015-01-28 DIAGNOSIS — R109 Unspecified abdominal pain: Secondary | ICD-10-CM | POA: Insufficient documentation

## 2015-01-28 DIAGNOSIS — O26899 Other specified pregnancy related conditions, unspecified trimester: Secondary | ICD-10-CM

## 2015-01-28 HISTORY — DX: Other specified health status: Z78.9

## 2015-01-28 LAB — COMPREHENSIVE METABOLIC PANEL
ALT: 12 U/L — AB (ref 14–54)
ANION GAP: 3 — AB (ref 5–15)
AST: 17 U/L (ref 15–41)
Albumin: 3.6 g/dL (ref 3.5–5.0)
Alkaline Phosphatase: 49 U/L (ref 38–126)
BUN: 8 mg/dL (ref 6–20)
CO2: 26 mmol/L (ref 22–32)
Calcium: 8.5 mg/dL — ABNORMAL LOW (ref 8.9–10.3)
Chloride: 106 mmol/L (ref 101–111)
Creatinine, Ser: 0.89 mg/dL (ref 0.44–1.00)
GFR calc non Af Amer: >60 mL/min (ref >60)
Glucose, Bld: 79 mg/dL (ref 65–99)
Potassium: 3.8 mmol/L (ref 3.5–5.1)
SODIUM: 135 mmol/L (ref 135–145)
Total Bilirubin: 0.8 mg/dL (ref 0.3–1.2)
Total Protein: 7.1 g/dL (ref 6.5–8.1)

## 2015-01-28 LAB — URINALYSIS, ROUTINE W REFLEX MICROSCOPIC
Bilirubin Urine: NEGATIVE
Glucose, UA: NEGATIVE mg/dL
Hgb urine dipstick: NEGATIVE
Ketones, ur: NEGATIVE mg/dL
LEUKOCYTES UA: NEGATIVE
Nitrite: NEGATIVE
Protein, ur: NEGATIVE mg/dL
SPECIFIC GRAVITY, URINE: 1.015 (ref 1.005–1.030)
Urobilinogen, UA: 0.2 mg/dL (ref 0.0–1.0)
pH: 6 (ref 5.0–8.0)

## 2015-01-28 LAB — AMYLASE: Amylase: 140 U/L — ABNORMAL HIGH (ref 28–100)

## 2015-01-28 LAB — CBC
HCT: 35.8 % — ABNORMAL LOW (ref 36.0–46.0)
Hemoglobin: 12.2 g/dL (ref 12.0–15.0)
MCH: 28.2 pg (ref 26.0–34.0)
MCHC: 34.1 g/dL (ref 30.0–36.0)
MCV: 82.9 fL (ref 78.0–100.0)
PLATELETS: 199 10*3/uL (ref 150–400)
RBC: 4.32 MIL/uL (ref 3.87–5.11)
RDW: 13.4 % (ref 11.5–15.5)
WBC: 7 10*3/uL (ref 4.0–10.5)

## 2015-01-28 LAB — LIPASE, BLOOD: LIPASE: 15 U/L — AB (ref 22–51)

## 2015-01-28 NOTE — MAU Note (Signed)
Pain in upper abd around 1600.  Felt like someone was grabbing stomach.  Still having some pain, not as bad now. No GI or GU complaints.

## 2015-01-28 NOTE — Discharge Instructions (Signed)

## 2015-01-28 NOTE — MAU Provider Note (Signed)
History     CSN: [id number]  Arrival date and time: 01/28/15 1732   None     Chief Complaint  Patient presents with  . Abdominal Pain   HPI Sylvia Fuller is 24 y.o. G1P0000 [redacted]w[redacted]d weeks presenting with upper abdominal pain that began suddenly at 1600 today.  She ate fried chicken nuggets and french fries hurriedly at 2pm.   She is a patient of Dr. Emeline Darling. She describes the pain as sharp and points to the epigastric region.  Nothing makes it worse, has lessened over time.  Rated the pain at onset as  6 /10 and currently 2 /10.  She has not taken anything for the pain.  She denies vaginal bleeding, discharge, lower abdominal pain.   Past Medical History  Diagnosis Date  . Medical history non-contributory     Past Surgical History  Procedure Laterality Date  . No past surgeries      History reviewed. No pertinent family history.  History  Substance Use Topics  . Smoking status: Never Smoker   . Smokeless tobacco: Not on file  . Alcohol Use: No     Comment: occasional    Allergies: No Known Allergies  Prescriptions prior to admission  Medication Sig Dispense Refill Last Dose  . metroNIDAZOLE (FLAGYL) 500 MG tablet Take 1 tablet (500 mg total) by mouth 2 (two) times daily. 14 tablet 0   . metroNIDAZOLE (METROGEL) 0.75 % vaginal gel Place 1 Applicatorful vaginally 2 (two) times daily. 70 g 0   . omeprazole (PRILOSEC) 20 MG capsule Take 1 capsule (20 mg total) by mouth daily. 30 capsule 1   . ondansetron (ZOFRAN) 4 MG tablet Take 1 tablet (4 mg total) by mouth every 8 (eight) hours as needed for nausea. May cause constipation 20 tablet 0     Review of Systems  Constitutional: Negative for fever and chills.  Respiratory: Negative for shortness of breath.   Cardiovascular: Negative for chest pain.  Gastrointestinal: Positive for heartburn and abdominal pain (epigastric pain ).  Genitourinary: Negative for dysuria, urgency, frequency, hematuria and flank pain.       Neg for  vaginal bleeding  Musculoskeletal: Negative for back pain.  Neurological: Negative for headaches.   Physical Exam   Blood pressure 117/69, pulse 76, temperature 98.2 F (36.8 C), temperature source Oral, resp. rate 16, height 5' 7.5" (1.715 m), weight 174 lb (78.926 kg), last menstrual period 10/22/2014.  Physical Exam  Nursing note and vitals reviewed. Constitutional: She is oriented to person, place, and time. She appears well-developed and well-nourished. No distress.  HENT:  Head: Normocephalic.  Neck: Normal range of motion.  Cardiovascular: Normal rate.   Respiratory: Effort normal.  GI: Soft. She exhibits no distension. There is no tenderness. There is no rebound and no guarding.  Genitourinary:  Pelvic exam Not indicated   FHR by doppler 154 bpm  Neurological: She is alert and oriented to person, place, and time.  Skin: Skin is warm and dry.  Psychiatric: She has a normal mood and affect. Her behavior is normal.   Results for orders placed or performed during the hospital encounter of 01/28/15 (from the past 24 hour(s))  CBC     Status: Abnormal   Collection Time: 01/28/15  6:20 PM  Result Value Ref Range   WBC 7.0 4.0 - 10.5 K/uL   RBC 4.32 3.87 - 5.11 MIL/uL   Hemoglobin 12.2 12.0 - 15.0 g/dL   HCT 40.9 (L) 81.1 - 91.4 %  MCV 82.9 78.0 - 100.0 fL   MCH 28.2 26.0 - 34.0 pg   MCHC 34.1 30.0 - 36.0 g/dL   RDW 16.1 09.6 - 04.5 %   Platelets 199 150 - 400 K/uL  Comprehensive metabolic panel     Status: Abnormal   Collection Time: 01/28/15  6:20 PM  Result Value Ref Range   Sodium 135 135 - 145 mmol/L   Potassium 3.8 3.5 - 5.1 mmol/L   Chloride 106 101 - 111 mmol/L   CO2 26 22 - 32 mmol/L   Glucose, Bld 79 65 - 99 mg/dL   BUN 8 6 - 20 mg/dL   Creatinine, Ser 4.09 0.44 - 1.00 mg/dL   Calcium 8.5 (L) 8.9 - 10.3 mg/dL   Total Protein 7.1 6.5 - 8.1 g/dL   Albumin 3.6 3.5 - 5.0 g/dL   AST 17 15 - 41 U/L   ALT 12 (L) 14 - 54 U/L   Alkaline Phosphatase 49 38 - 126  U/L   Total Bilirubin 0.8 0.3 - 1.2 mg/dL   GFR calc non Af Amer >60 >60 mL/min   GFR calc Af Amer >60 >60 mL/min   Anion gap 3 (L) 5 - 15  Lipase, blood     Status: Abnormal   Collection Time: 01/28/15  6:20 PM  Result Value Ref Range   Lipase 15 (L) 22 - 51 U/L  Amylase     Status: Abnormal   Collection Time: 01/28/15  6:20 PM  Result Value Ref Range   Amylase 140 (H) 28 - 100 U/L    MAU Course  Procedures  MDM MSE Exam Lab Consulted with Dr. Suszanne Conners given to scheduled Outpatient Gallbladder U/S for next week, discharge home with instructed to avoid greasy, fatty foods.  Assessment and Plan  A:  Upper/mid abdominal Pain at [redacted] weeks gestation      Elevated Amylase  P:  Patient informed of Plan of Care for  Gall bladder U/S next week      Avoid fatty, greasy foods     Call for worsening sxs.          Briante Loveall,EVE M 01/28/2015, 6:47 PM

## 2015-02-16 ENCOUNTER — Inpatient Hospital Stay (HOSPITAL_COMMUNITY)
Admission: AD | Admit: 2015-02-16 | Discharge: 2015-02-16 | Disposition: A | Discharge status: Home / Self Care | Payer: Medicaid Other | Source: Ambulatory Visit | Attending: Obstetrics and Gynecology | Admitting: Obstetrics and Gynecology

## 2015-02-16 ENCOUNTER — Encounter (HOSPITAL_COMMUNITY): Payer: Self-pay | Admitting: *Deleted

## 2015-02-16 ENCOUNTER — Inpatient Hospital Stay (HOSPITAL_COMMUNITY): Payer: Medicaid Other

## 2015-02-16 DIAGNOSIS — K219 Gastro-esophageal reflux disease without esophagitis: Secondary | ICD-10-CM | POA: Diagnosis not present

## 2015-02-16 DIAGNOSIS — R1010 Upper abdominal pain, unspecified: Secondary | ICD-10-CM | POA: Diagnosis not present

## 2015-02-16 DIAGNOSIS — O26899 Other specified pregnancy related conditions, unspecified trimester: Secondary | ICD-10-CM | POA: Insufficient documentation

## 2015-02-16 LAB — URINALYSIS, ROUTINE W REFLEX MICROSCOPIC
BILIRUBIN URINE: NEGATIVE
GLUCOSE, UA: NEGATIVE mg/dL
HGB URINE DIPSTICK: NEGATIVE
Ketones, ur: NEGATIVE mg/dL
Leukocytes, UA: NEGATIVE
Nitrite: NEGATIVE
Protein, ur: NEGATIVE mg/dL
SPECIFIC GRAVITY, URINE: 1.015 (ref 1.005–1.030)
Urobilinogen, UA: 0.2 mg/dL (ref 0.0–1.0)
pH: 7.5 (ref 5.0–8.0)

## 2015-02-16 LAB — COMPREHENSIVE METABOLIC PANEL
ALK PHOS: 53 U/L (ref 38–126)
ALT: 12 U/L — AB (ref 14–54)
ANION GAP: 9 (ref 5–15)
AST: 18 U/L (ref 15–41)
Albumin: 3.8 g/dL (ref 3.5–5.0)
BILIRUBIN TOTAL: 0.7 mg/dL (ref 0.3–1.2)
BUN: 8 mg/dL (ref 6–20)
CO2: 26 mmol/L (ref 22–32)
CREATININE: 0.88 mg/dL (ref 0.44–1.00)
Calcium: 9.1 mg/dL (ref 8.9–10.3)
Chloride: 102 mmol/L (ref 101–111)
GFR calc Af Amer: >60 mL/min (ref >60)
Glucose, Bld: 92 mg/dL (ref 65–99)
Potassium: 3.4 mmol/L — ABNORMAL LOW (ref 3.5–5.1)
SODIUM: 137 mmol/L (ref 135–145)
TOTAL PROTEIN: 7.2 g/dL (ref 6.5–8.1)

## 2015-02-16 LAB — CBC
HEMATOCRIT: 37.3 % (ref 36.0–46.0)
Hemoglobin: 12.8 g/dL (ref 12.0–15.0)
MCH: 28.6 pg (ref 26.0–34.0)
MCHC: 34.3 g/dL (ref 30.0–36.0)
MCV: 83.4 fL (ref 78.0–100.0)
Platelets: 203 10*3/uL (ref 150–400)
RBC: 4.47 MIL/uL (ref 3.87–5.11)
RDW: 13.4 % (ref 11.5–15.5)
WBC: 7.5 10*3/uL (ref 4.0–10.5)

## 2015-02-16 LAB — AMYLASE: Amylase: 146 U/L — ABNORMAL HIGH (ref 28–100)

## 2015-02-16 LAB — LIPASE, BLOOD: Lipase: 14 U/L — ABNORMAL LOW (ref 22–51)

## 2015-02-16 MED ORDER — FAMOTIDINE IN NACL 20-0.9 MG/50ML-% IV SOLN
20.0000 mg | Freq: Once | INTRAVENOUS | Status: AC
  Administered 2015-02-16: 20 mg via INTRAVENOUS
  Filled 2015-02-16: qty 50

## 2015-02-16 MED ORDER — HYDROMORPHONE HCL 1 MG/ML IJ SOLN
1.0000 mg | Freq: Once | INTRAMUSCULAR | Status: AC
  Administered 2015-02-16: 1 mg via INTRAVENOUS
  Filled 2015-02-16: qty 1

## 2015-02-16 MED ORDER — LACTATED RINGERS IV BOLUS (SEPSIS): 1000.0000 mL | Freq: Once | INTRAVENOUS | Status: DC

## 2015-02-16 MED ORDER — LACTATED RINGERS IV BOLUS (SEPSIS)
125.0000 mL | Freq: Once | INTRAVENOUS | Status: DC
  Administered 2015-02-16: 125 mL via INTRAVENOUS

## 2015-02-16 MED ORDER — RANITIDINE HCL 150 MG PO TABS: 150.0000 mg | ORAL_TABLET | Freq: Two times a day (BID) | ORAL | Status: DC

## 2015-02-16 NOTE — MAU Note (Signed)
Patient given  Dilaudid IV and began to get hot and nervous. Patient stated she was having an anxiety attack. She fell backwards on the bed towards her friend then lunged towards me in a bear hug and would not let me go. Kept saying " Don't let me go Ms. Selena Batten. Don't let me go!" I finally got her to calm down by holding her and stroking her back. She was able to gain composure and go to ultrasound with no issues.

## 2015-02-16 NOTE — MAU Note (Signed)
Pt presents with complaint of severe left lower abd pain that started about one hour ago. Denies bleeding

## 2015-02-16 NOTE — MAU Provider Note (Signed)
History     CSN: [id number]  Arrival date and time: 02/16/15 0206   First Provider Initiated Contact with Patient 02/16/15 0302      No chief complaint on file.  HPI   [name] is a 24 y.o. female G1P0000 at 56w5dpresenting to MAU with left upper and lower quadrant pain. The pains started tonight while sleeping, the pain woke her up out of her sleep. The pain is described as an achy pain, the pain comes and goes. She has not taken anything for the pain. She has been to the ER with abdominal pain 2 other times and says this pain is "different than those times."   She is non-compliant with plan or care/treatment. She was diagnosed with GERD in May and did not start the medication that was prescribed.   On 7/29 she was seen for upper abdominal pain following a meal, she had elevated amylase (140).  She was scheduled to have an outpatient gall bladder scan, however the patient has not scheduled a date and time for the test. The patient was instructed to avoid fatty, greasy foods. In the last 48 hours the patient has eaten spaghetti, a cheese burger, tKuwaitsandwhich and bo jangles yesterday.   The pain worsens when she walks.  The pain is relieved some if she pushes on her abdomen.  She denies vaginal bleeding or leaking of fluid.   She denies fever or chills.  OB History    Gravida Para Term Preterm AB TAB SAB Ectopic Multiple Living   1 0 0 0 0 0 0 0 0 0      Past Medical History  Diagnosis Date  . Medical history non-contributory     Past Surgical History  Procedure Laterality Date  . No past surgeries      Family History  Problem Relation Age of Onset  . Hypertension Maternal Grandfather     Social History  Substance Use Topics  . Smoking status: Never Smoker   . Smokeless tobacco: None  . Alcohol Use: No     Comment: occasional    Allergies: No Known Allergies  Prescriptions prior to admission  Medication Sig Dispense Refill Last Dose  . Prenatal  Vit-Fe Fumarate-FA (MULTIVITAMIN-PRENATAL) 27-0.8 MG TABS tablet Take 1 tablet by mouth daily at 12 noon.   02/16/2015 at Unknown time   Results for orders placed or performed during the hospital encounter of 02/16/15 (from the past 48 hour(s))  [facility], [facility]     Status: None   Collection Time: 02/16/15  2:40 AM  Result Value Ref Range   Color, Urine YELLOW YELLOW   APPearance CLEAR CLEAR   Specific Gravity, Urine 1.015 1.005 - 1.030   pH 7.5 5.0 - 8.0   Glucose, UA NEGATIVE NEGATIVE mg/dL   Hgb urine dipstick NEGATIVE NEGATIVE   Bilirubin Urine NEGATIVE NEGATIVE   Ketones, ur NEGATIVE NEGATIVE mg/dL   Protein, ur NEGATIVE NEGATIVE mg/dL   Urobilinogen, UA 0.2 0.0 - 1.0 mg/dL   Nitrite NEGATIVE NEGATIVE   Leukocytes, UA NEGATIVE NEGATIVE    Comment: MICROSCOPIC NOT DONE ON URINES WITH NEGATIVE PROTEIN, BLOOD, LEUKOCYTES, NITRITE, OR GLUCOSE <1000 mg/dL.  CBC     Status: None   Collection Time: 02/16/15  3:47 AM  Result Value Ref Range   WBC 7.5 4.0 - 10.5 K/uL   RBC 4.47 3.87 - 5.11 MIL/uL   Hemoglobin 12.8 12.0 - 15.0 g/dL   HCT 37.3 36.0 -  46.0 %   MCV 83.4 78.0 - 100.0 fL   MCH 28.6 26.0 - 34.0 pg   MCHC 34.3 30.0 - 36.0 g/dL   RDW 13.4 11.5 - 15.5 %   Platelets 203 150 - 400 K/uL  Amylase     Status: Abnormal   Collection Time: 02/16/15  3:47 AM  Result Value Ref Range   Amylase 146 (H) 28 - 100 U/L  Lipase, blood     Status: Abnormal   Collection Time: 02/16/15  3:47 AM  Result Value Ref Range   Lipase 14 (L) 22 - 51 U/L  Comprehensive metabolic panel     Status: Abnormal   Collection Time: 02/16/15  3:47 AM  Result Value Ref Range   Sodium 137 135 - 145 mmol/L   Potassium 3.4 (L) 3.5 - 5.1 mmol/L   Chloride 102 101 - 111 mmol/L   CO2 26 22 - 32 mmol/L   Glucose, Bld 92 65 - 99 mg/dL   BUN 8 6 - 20 mg/dL   Creatinine, Ser 0.88 0.44 - 1.00 mg/dL   Calcium 9.1 8.9 - 10.3 mg/dL   Total Protein 7.2 6.5 - 8.1 g/dL   Albumin  3.8 3.5 - 5.0 g/dL   AST 18 15 - 41 U/L   ALT 12 (L) 14 - 54 U/L   Alkaline Phosphatase 53 38 - 126 U/L   Total Bilirubin 0.7 0.3 - 1.2 mg/dL   GFR calc non Af Amer >60 >60 mL/min   GFR calc Af Amer >60 >60 mL/min    Comment: (NOTE) The eGFR has been calculated using the CKD EPI equation. This calculation has not been validated in all clinical situations. eGFR's persistently <60 mL/min signify possible Chronic Kidney Disease.    Anion gap 9 5 - 15   US Abdomen Limited Ruq  02/16/2015   CLINICAL DATA:  Upper abdominal pain.  Second trimester pregnancy.  EXAM: US ABDOMEN LIMITED - RIGHT UPPER QUADRANT  COMPARISON:  None.  FINDINGS: Gallbladder:  No gallstones or wall thickening visualized. No sonographic Murphy sign noted.  Common bile duct:  Diameter: 4 mm  Liver:  No focal lesion identified. Within normal limits in parenchymal echogenicity.  IMPRESSION: Normal abdominal ultrasound.   Electronically Signed   By: Monte Fantasia M.D.   On: 02/16/2015 04:45    Review of Systems  Constitutional: Negative for fever.  Gastrointestinal: Positive for heartburn and abdominal pain (epigastric pain-left upper quadrant ). Negative for nausea, vomiting, diarrhea and constipation.  Genitourinary: Negative for dysuria and urgency.       Denies vaginal bleeding    Physical Exam   Blood pressure 131/59, pulse 78, temperature 98.5 F (36.9 C), temperature source Oral, resp. rate 18, height 5' 7.5" (1.715 m), weight 79.379 kg (175 lb), last menstrual period 10/22/2014, SpO2 100 %.  Physical Exam  Constitutional: She is oriented to person, place, and time. She appears well-developed and well-nourished. She appears distressed.  Eyes: Pupils are equal, round, and reactive to light.  Neck: Neck supple.  Respiratory: Effort normal.  GI: Soft. Normal appearance and bowel sounds are normal. There is tenderness in the right upper quadrant, epigastric area, left upper quadrant and left lower quadrant. There  is rigidity, guarding and positive Murphy's sign. There is no rebound.  Genitourinary:  Cervix closed, thick and posterior   Neurological: She is alert and oriented to person, place, and time.  Skin: Skin is warm. She is not diaphoretic.  Psychiatric: Her behavior is normal.  MAU Course  Procedures  None  MDM  + fetal heart tones via doppler.   Left message on Dr. Ivor Costa cell phone at [phone] ( call returned at 04:13) RUQ [place] ordered Amylase, lipase, CBC, CMET Dilaudid 1 mg IV Pepcid   The patient had a small BM while here and says she feels somewhat better. She says that she was unable to push like with a normal BM due to the pain/ pressure in her abdomen.   Discussed labs, [place] with Dr. Terri Piedra at [phone] Patient not tender on exam at this point; she currently rates her pain 3/10 and is sleeping in the room.   Assessment and Plan   A:  Upper abdominal pain GERD in pregnancy   P:  Discharge home in stable condition RX: Zantac  Go to [place] or [facility] ED if pain returns.  Avoid fatty, fried foods.  Follow up with OB in the next 24 hours     Lezlie Lye, NP 02/16/2015 3:07 AM

## 2015-02-28 ENCOUNTER — Encounter: Payer: Self-pay | Admitting: Obstetrics and Gynecology

## 2015-04-07 ENCOUNTER — Inpatient Hospital Stay (HOSPITAL_COMMUNITY)
Admission: AD | Admit: 2015-04-07 | Discharge: 2015-04-07 | Disposition: A | Discharge status: Home / Self Care | Payer: Medicaid Other | Source: Ambulatory Visit | Attending: Obstetrics and Gynecology | Admitting: Obstetrics and Gynecology

## 2015-04-07 ENCOUNTER — Encounter (HOSPITAL_COMMUNITY): Payer: Self-pay | Admitting: *Deleted

## 2015-04-07 DIAGNOSIS — O368120 Decreased fetal movements, second trimester, not applicable or unspecified: Secondary | ICD-10-CM

## 2015-04-07 DIAGNOSIS — Z3A23 23 weeks gestation of pregnancy: Secondary | ICD-10-CM | POA: Insufficient documentation

## 2015-04-07 LAB — URINALYSIS, ROUTINE W REFLEX MICROSCOPIC
Bilirubin Urine: NEGATIVE
Glucose, UA: NEGATIVE mg/dL
HGB URINE DIPSTICK: NEGATIVE
Ketones, ur: NEGATIVE mg/dL
LEUKOCYTES UA: NEGATIVE
NITRITE: NEGATIVE
PROTEIN: NEGATIVE mg/dL
SPECIFIC GRAVITY, URINE: 1.01 (ref 1.005–1.030)
UROBILINOGEN UA: 0.2 mg/dL (ref 0.0–1.0)
pH: 7 (ref 5.0–8.0)

## 2015-04-07 NOTE — MAU Provider Note (Signed)
  History     CSN: [id number]  Arrival date and time: 04/07/15 1252   First Provider Initiated Contact with Patient 04/07/15 1355      Chief Complaint  Patient presents with  . Decreased Fetal Movement   HPI Shailyn Weyandt 24 y.o. G1P0000  presents for decreased fetal movment.  She has been feeling regular movements since 16 weeks.  She noted the baby move at 9pm and then no further movements noted until she arrived here.  She has felt the baby move here several times.  She denies LOF, VB, contractions, weakness, dysuria, abdominal pain.   OB History    Gravida Para Term Preterm AB TAB SAB Ectopic Multiple Living        Past Medical History  Diagnosis Date  . Medical history non-contributory     Past Surgical History  Procedure Laterality Date  . No past surgeries    . Cleft palate repair      Family History  Problem Relation Age of Onset  . Hypertension Maternal Grandfather     Social History  Substance Use Topics  . Smoking status: Never Smoker   . Smokeless tobacco: None  . Alcohol Use: No     Comment: occasional    Allergies: No Known Allergies  Prescriptions prior to admission  Medication Sig Dispense Refill Last Dose  . Prenatal Vit-Fe Fumarate-FA (MULTIVITAMIN-PRENATAL) 27-0.8 MG TABS tablet Take 1 tablet by mouth daily at 12 noon.   02/16/2015 at Unknown time  . ranitidine (ZANTAC) 150 MG tablet Take 1 tablet (150 mg total) by mouth 2 (two) times daily. 60 tablet 0     ROS Pertinent ROS in HPI.  All other systems are negative.   Physical Exam   Blood pressure 113/58, pulse 75, temperature 98.4 F (36.9 C), temperature source Oral, resp. rate 20, last menstrual period 10/22/2014.  Physical Exam  Constitutional: She is oriented to person, place, and time. She appears well-developed and well-nourished. No distress.  HENT:  Head: Normocephalic and atraumatic.  Eyes: EOM are normal.  Neck: Normal range of motion.   Cardiovascular: Normal rate.   Respiratory: No respiratory distress.  GI: Soft. She exhibits no distension. There is no tenderness. There is no rebound and no guarding.  Musculoskeletal: Normal range of motion.  Neurological: She is alert and oriented to person, place, and time.  Skin: Skin is warm and dry.  Psychiatric: She has a normal mood and affect.   Fetal Tracing: Poor tracing due to gestational age but able to hear fetal heart tones and track them sporadically without any problem identified.    MAU Course  Procedures  MDM Pt now feeling good fetal movement and no issues identified.  Discussed with Dr. Ellyn Hack whom is in agreement to discharge to home  Assessment and Plan  A:  1. Decreased fetal movement, second trimester, not applicable or unspecified fetus    P: Discharge to home Keep ob appts Good self care encouraged. Precautions given Patient may return to MAU as needed or if her condition were to change or worsen   Bertram Denver 04/07/2015, 1:56 PM

## 2015-04-07 NOTE — Discharge Instructions (Signed)
Fetal Movement Counts  Patient Name: __________________________________________________ Patient Due Date: ____________________  Performing a fetal movement count is highly recommended in high-risk pregnancies, but it is good for every pregnant woman to do. Your health care provider may ask you to start counting fetal movements at 28 weeks of the pregnancy. Fetal movements often increase:  · After eating a full meal.  · After physical activity.  · After eating or drinking something sweet or cold.  · At rest.  Pay attention to when you feel the baby is most active. This will help you notice a pattern of your baby's sleep and wake cycles and what factors contribute to an increase in fetal movement. It is important to perform a fetal movement count at the same time each day when your baby is normally most active.   HOW TO COUNT FETAL MOVEMENTS  1. Find a quiet and comfortable area to sit or lie down on your left side. Lying on your left side provides the best blood and oxygen circulation to your baby.  2. Write down the day and time on a sheet of paper or in a journal.  3. Start counting kicks, flutters, swishes, rolls, or jabs in a 2-hour period. You should feel at least 10 movements within 2 hours.  4. If you do not feel 10 movements in 2 hours, wait 2-3 hours and count again. Look for a change in the pattern or not enough counts in 2 hours.  SEEK MEDICAL CARE IF:  · You feel less than 10 counts in 2 hours, tried twice.  · There is no movement in over an hour.  · The pattern is changing or taking longer each day to reach 10 counts in 2 hours.  · You feel the baby is not moving as he or she usually does.  Date: ____________ Movements: ____________ Start time: ____________ Finish time: ____________   Date: ____________ Movements: ____________ Start time: ____________ Finish time: ____________  Date: ____________ Movements: ____________ Start time: ____________ Finish time: ____________  Date: ____________ Movements:  ____________ Start time: ____________ Finish time: ____________  Date: ____________ Movements: ____________ Start time: ____________ Finish time: ____________  Date: ____________ Movements: ____________ Start time: ____________ Finish time: ____________  Date: ____________ Movements: ____________ Start time: ____________ Finish time: ____________  Date: ____________ Movements: ____________ Start time: ____________ Finish time: ____________   Date: ____________ Movements: ____________ Start time: ____________ Finish time: ____________  Date: ____________ Movements: ____________ Start time: ____________ Finish time: ____________  Date: ____________ Movements: ____________ Start time: ____________ Finish time: ____________  Date: ____________ Movements: ____________ Start time: ____________ Finish time: ____________  Date: ____________ Movements: ____________ Start time: ____________ Finish time: ____________  Date: ____________ Movements: ____________ Start time: ____________ Finish time: ____________  Date: ____________ Movements: ____________ Start time: ____________ Finish time: ____________   Date: ____________ Movements: ____________ Start time: ____________ Finish time: ____________  Date: ____________ Movements: ____________ Start time: ____________ Finish time: ____________  Date: ____________ Movements: ____________ Start time: ____________ Finish time: ____________  Date: ____________ Movements: ____________ Start time: ____________ Finish time: ____________  Date: ____________ Movements: ____________ Start time: ____________ Finish time: ____________  Date: ____________ Movements: ____________ Start time: ____________ Finish time: ____________  Date: ____________ Movements: ____________ Start time: ____________ Finish time: ____________   Date: ____________ Movements: ____________ Start time: ____________ Finish time: ____________  Date: ____________ Movements: ____________ Start time: ____________ Finish  time: ____________  Date: ____________ Movements: ____________ Start time: ____________ Finish time: ____________  Date: ____________ Movements: ____________ Start time:   ____________ Finish time: ____________  Date: ____________ Movements: ____________ Start time: ____________ Finish time: ____________  Date: ____________ Movements: ____________ Start time: ____________ Finish time: ____________  Date: ____________ Movements: ____________ Start time: ____________ Finish time: ____________   Date: ____________ Movements: ____________ Start time: ____________ Finish time: ____________  Date: ____________ Movements: ____________ Start time: ____________ Finish time: ____________  Date: ____________ Movements: ____________ Start time: ____________ Finish time: ____________  Date: ____________ Movements: ____________ Start time: ____________ Finish time: ____________  Date: ____________ Movements: ____________ Start time: ____________ Finish time: ____________  Date: ____________ Movements: ____________ Start time: ____________ Finish time: ____________  Date: ____________ Movements: ____________ Start time: ____________ Finish time: ____________   Date: ____________ Movements: ____________ Start time: ____________ Finish time: ____________  Date: ____________ Movements: ____________ Start time: ____________ Finish time: ____________  Date: ____________ Movements: ____________ Start time: ____________ Finish time: ____________  Date: ____________ Movements: ____________ Start time: ____________ Finish time: ____________  Date: ____________ Movements: ____________ Start time: ____________ Finish time: ____________  Date: ____________ Movements: ____________ Start time: ____________ Finish time: ____________  Date: ____________ Movements: ____________ Start time: ____________ Finish time: ____________   Date: ____________ Movements: ____________ Start time: ____________ Finish time: ____________  Date: ____________  Movements: ____________ Start time: ____________ Finish time: ____________  Date: ____________ Movements: ____________ Start time: ____________ Finish time: ____________  Date: ____________ Movements: ____________ Start time: ____________ Finish time: ____________  Date: ____________ Movements: ____________ Start time: ____________ Finish time: ____________  Date: ____________ Movements: ____________ Start time: ____________ Finish time: ____________  Date: ____________ Movements: ____________ Start time: ____________ Finish time: ____________   Date: ____________ Movements: ____________ Start time: ____________ Finish time: ____________  Date: ____________ Movements: ____________ Start time: ____________ Finish time: ____________  Date: ____________ Movements: ____________ Start time: ____________ Finish time: ____________  Date: ____________ Movements: ____________ Start time: ____________ Finish time: ____________  Date: ____________ Movements: ____________ Start time: ____________ Finish time: ____________  Date: ____________ Movements: ____________ Start time: ____________ Finish time: ____________     This information is not intended to replace advice given to you by your health care provider. Make sure you discuss any questions you have with your health care provider.     Document Released: 07/18/2006 Document Revised: 07/09/2014 Document Reviewed: 04/14/2012  Elsevier Interactive Patient Education ©2016 [employer]

## 2015-04-07 NOTE — MAU Note (Signed)
Pt presents to MAU with complaints of a decrease in fetal movement since yesterday.Denies any vaginal bleeding or discharge

## 2015-06-28 LAB — OB RESULTS CONSOLE GBS: STREP GROUP B AG: NEGATIVE

## 2015-07-03 NOTE — L&D Delivery Note (Signed)
Delivery Note At 3:26 PM a viable and healthy female was delivered via  (Presentation: OA; LOT  ).  APGAR: 9,9 ; weight P  .   Placenta status: delivered, intact.  Cord: 3VC with the following complications: none .    Anesthesia: Epidural  Episiotomy: none  Lacerations:  B labial Suture Repair: 3.0 vicryl rapide Est. Blood Loss (mL):  100cc  Mom to postpartum.  Baby to Couplet care / Skin to Skin.  Bovard-Stuckert, Sylvia Fuller 07/25/2015, 3:44 PM  O+/RI/Tdap in PNC/ Nexplanon in [facility]

## 2015-07-24 ENCOUNTER — Inpatient Hospital Stay (HOSPITAL_COMMUNITY)
Admission: AD | Admit: 2015-07-24 | Discharge: 2015-07-24 | Disposition: A | Discharge status: Home / Self Care | Payer: Medicaid Other | Source: Ambulatory Visit | Attending: Obstetrics and Gynecology | Admitting: Obstetrics and Gynecology

## 2015-07-24 ENCOUNTER — Encounter (HOSPITAL_COMMUNITY): Payer: Self-pay | Admitting: *Deleted

## 2015-07-24 HISTORY — DX: Anxiety disorder, unspecified: F41.9

## 2015-07-24 NOTE — Discharge Instructions (Signed)
Please keep appointments with your OB/GYN.  If contractions become more regular, frequent, or increase in intensity, please call your OB/GYN as soon as possible.

## 2015-07-24 NOTE — MAU Note (Signed)
Pt states has been having contractions since Thursday and lost mucous plug yesterday and today.  Contractions are coming irregularly every 5-20 minutes.

## 2015-07-25 ENCOUNTER — Inpatient Hospital Stay (HOSPITAL_COMMUNITY): Payer: Medicaid Other | Admitting: Anesthesiology

## 2015-07-25 ENCOUNTER — Inpatient Hospital Stay (HOSPITAL_COMMUNITY)
Admission: AD | Admit: 2015-07-25 | Discharge: 2015-07-27 | DRG: 775 | Disposition: A | Discharge status: Home / Self Care | Payer: Medicaid Other | Source: Ambulatory Visit | Attending: Obstetrics and Gynecology | Admitting: Obstetrics and Gynecology

## 2015-07-25 ENCOUNTER — Encounter (HOSPITAL_COMMUNITY): Payer: Self-pay | Admitting: *Deleted

## 2015-07-25 DIAGNOSIS — Z8249 Family history of ischemic heart disease and other diseases of the circulatory system: Secondary | ICD-10-CM

## 2015-07-25 DIAGNOSIS — Z3A39 39 weeks gestation of pregnancy: Secondary | ICD-10-CM | POA: Diagnosis not present

## 2015-07-25 LAB — CBC
HEMATOCRIT: 41.6 % (ref 36.0–46.0)
HEMOGLOBIN: 14.8 g/dL (ref 12.0–15.0)
MCH: 29.5 pg (ref 26.0–34.0)
MCHC: 35.6 g/dL (ref 30.0–36.0)
MCV: 83 fL (ref 78.0–100.0)
Platelets: 212 10*3/uL (ref 150–400)
RBC: 5.01 MIL/uL (ref 3.87–5.11)
RDW: 13.9 % (ref 11.5–15.5)
WBC: 10 10*3/uL (ref 4.0–10.5)

## 2015-07-25 LAB — TYPE AND SCREEN
ABO/RH(D): O POS
Antibody Screen: NEGATIVE

## 2015-07-25 LAB — RPR: RPR Ser Ql: NONREACTIVE

## 2015-07-25 LAB — ABO/RH: ABO/RH(D): O POS

## 2015-07-25 MED ORDER — PRENATAL MULTIVITAMIN CH
1.0000 | ORAL_TABLET | Freq: Every day | ORAL | Status: DC
  Administered 2015-07-26 – 2015-07-27 (×2): 1 via ORAL
  Filled 2015-07-25 (×2): qty 1

## 2015-07-25 MED ORDER — OXYTOCIN BOLUS FROM INFUSION: 500.0000 mL | INTRAVENOUS | Status: DC

## 2015-07-25 MED ORDER — LIDOCAINE HCL (PF) 1 % IJ SOLN
30.0000 mL | INTRAMUSCULAR | Status: DC | PRN
  Filled 2015-07-25: qty 30

## 2015-07-25 MED ORDER — IBUPROFEN 600 MG PO TABS
600.0000 mg | ORAL_TABLET | Freq: Four times a day (QID) | ORAL | Status: DC
  Administered 2015-07-25 – 2015-07-27 (×8): 600 mg via ORAL
  Filled 2015-07-25 (×8): qty 1

## 2015-07-25 MED ORDER — LACTATED RINGERS IV SOLN
INTRAVENOUS | Status: DC
  Administered 2015-07-25 (×3): via INTRAVENOUS

## 2015-07-25 MED ORDER — LACTATED RINGERS IV SOLN: 500.0000 mL | INTRAVENOUS | Status: DC | PRN

## 2015-07-25 MED ORDER — LANOLIN HYDROUS EX OINT: TOPICAL_OINTMENT | CUTANEOUS | Status: DC | PRN

## 2015-07-25 MED ORDER — CITRIC ACID-SODIUM CITRATE 334-500 MG/5ML PO SOLN: 30.0000 mL | ORAL | Status: DC | PRN

## 2015-07-25 MED ORDER — LACTATED RINGERS IV SOLN
INTRAVENOUS | Status: DC
Start: 2015-07-25 — End: 2015-07-27

## 2015-07-25 MED ORDER — ACETAMINOPHEN 325 MG PO TABS: 650.0000 mg | ORAL_TABLET | ORAL | Status: DC | PRN

## 2015-07-25 MED ORDER — LACTATED RINGERS IV SOLN
2.5000 [IU]/h | INTRAVENOUS | Status: DC
  Filled 2015-07-25: qty 4

## 2015-07-25 MED ORDER — BENZOCAINE-MENTHOL 20-0.5 % EX AERO
1.0000 "application " | INHALATION_SPRAY | CUTANEOUS | Status: DC | PRN
  Administered 2015-07-25: 1 via TOPICAL
  Filled 2015-07-25: qty 56

## 2015-07-25 MED ORDER — ONDANSETRON HCL 4 MG/2ML IJ SOLN: 4.0000 mg | Freq: Four times a day (QID) | INTRAMUSCULAR | Status: DC | PRN

## 2015-07-25 MED ORDER — OXYCODONE-ACETAMINOPHEN 5-325 MG PO TABS
2.0000 | ORAL_TABLET | ORAL | Status: DC | PRN
Start: 2015-07-25 — End: 2015-07-25

## 2015-07-25 MED ORDER — FENTANYL 2.5 MCG/ML BUPIVACAINE 1/10 % EPIDURAL INFUSION (WH - ANES)
14.0000 mL/h | INTRAMUSCULAR | Status: DC | PRN
  Administered 2015-07-25: 14 mL/h via EPIDURAL
  Filled 2015-07-25: qty 125

## 2015-07-25 MED ORDER — EPHEDRINE 5 MG/ML INJ
10.0000 mg | INTRAVENOUS | Status: DC | PRN
Start: 2015-07-25 — End: 2015-07-27
  Filled 2015-07-25: qty 2

## 2015-07-25 MED ORDER — FLEET ENEMA 7-19 GM/118ML RE ENEM: 1.0000 | ENEMA | RECTAL | Status: DC | PRN

## 2015-07-25 MED ORDER — PHENYLEPHRINE 40 MCG/ML (10ML) SYRINGE FOR IV PUSH (FOR BLOOD PRESSURE SUPPORT)
80.0000 ug | PREFILLED_SYRINGE | INTRAVENOUS | Status: DC | PRN
  Filled 2015-07-25: qty 20
  Filled 2015-07-25: qty 2

## 2015-07-25 MED ORDER — ONDANSETRON HCL 4 MG/2ML IJ SOLN: 4.0000 mg | INTRAMUSCULAR | Status: DC | PRN

## 2015-07-25 MED ORDER — SENNOSIDES-DOCUSATE SODIUM 8.6-50 MG PO TABS
2.0000 | ORAL_TABLET | ORAL | Status: DC
  Administered 2015-07-25 – 2015-07-27 (×2): 2 via ORAL
  Filled 2015-07-25 (×2): qty 2

## 2015-07-25 MED ORDER — WITCH HAZEL-GLYCERIN EX PADS: 1.0000 "application " | MEDICATED_PAD | CUTANEOUS | Status: DC | PRN

## 2015-07-25 MED ORDER — ONDANSETRON HCL 4 MG PO TABS: 4.0000 mg | ORAL_TABLET | ORAL | Status: DC | PRN

## 2015-07-25 MED ORDER — DIPHENHYDRAMINE HCL 25 MG PO CAPS: 25.0000 mg | ORAL_CAPSULE | Freq: Four times a day (QID) | ORAL | Status: DC | PRN

## 2015-07-25 MED ORDER — SIMETHICONE 80 MG PO CHEW: 80.0000 mg | CHEWABLE_TABLET | ORAL | Status: DC | PRN

## 2015-07-25 MED ORDER — ZOLPIDEM TARTRATE 5 MG PO TABS
5.0000 mg | ORAL_TABLET | Freq: Every evening | ORAL | Status: DC | PRN
Start: 2015-07-25 — End: 2015-07-27

## 2015-07-25 MED ORDER — DIPHENHYDRAMINE HCL 50 MG/ML IJ SOLN: 12.5000 mg | INTRAMUSCULAR | Status: DC | PRN

## 2015-07-25 MED ORDER — DIBUCAINE 1 % RE OINT: 1.0000 "application " | TOPICAL_OINTMENT | RECTAL | Status: DC | PRN

## 2015-07-25 MED ORDER — OXYCODONE-ACETAMINOPHEN 5-325 MG PO TABS: 1.0000 | ORAL_TABLET | ORAL | Status: DC | PRN

## 2015-07-25 MED ORDER — LIDOCAINE HCL (PF) 1 % IJ SOLN
INTRAMUSCULAR | Status: DC | PRN
  Administered 2015-07-25: 4 mL
  Administered 2015-07-25: 6 mL via EPIDURAL

## 2015-07-25 NOTE — Progress Notes (Signed)
Pt. Removed from EFM for unrestricted movement. FHR tracing category 1 before removal of EFM

## 2015-07-25 NOTE — H&P (Signed)
Sylvia Fuller is a 25 y.o. female G1P0 at 39+ weeks (EDD 07/29/15 by LMP c/w 9 week [place]) presenting for labor.  Pt seen yesterday in MAU with irregular contractions and cervix 2cm, returned today with regular contractions and cervix 3+.  Baby category 1. Prenatal care uncomplicated except sickle trait carrier.  FOB offered testing but never did.  Maternal Medical History:  Reason for admission: Contractions.   Contractions: Onset was 6-12 hours ago.   Frequency: regular.   Perceived severity is moderate.    Fetal activity: Perceived fetal activity is normal.    Prenatal Complications - Diabetes: none.    OB History    Gravida Para Term Preterm AB TAB SAB Ectopic Multiple Living       Past Medical History  Diagnosis Date  . Medical history non-contributory   . Anxiety    Past Surgical History  Procedure Laterality Date  . No past surgeries    . Cleft palate repair     Family History: family history includes Hypertension in her maternal grandfather. Social History:  reports that she has never smoked. She has never used smokeless tobacco. She reports that she does not drink alcohol or use illicit drugs.   Prenatal Transfer Tool  Maternal Diabetes: No Genetic Screening: Normal Maternal Ultrasounds/Referrals: Normal Fetal Ultrasounds or other Referrals:  None Maternal Substance Abuse:  No Significant Maternal Medications:  None Significant Maternal Lab Results:  None Other Comments:  None  Review of Systems  Gastrointestinal: Positive for abdominal pain.    Dilation: 3 Effacement (%): 90 Station: -1 Exam by:: Weston,RN Blood pressure 141/68, pulse 90, temperature 98.3 F (36.8 C), temperature source Oral, resp. rate 16, height 5' 7.5" (1.715 m), weight 92.08 kg (203 lb), last menstrual period 10/22/2014. Maternal Exam:  Abdomen: Patient reports no abdominal tenderness. Fetal presentation: vertex  Introitus: Normal vulva. Normal vagina.     Physical Exam  Constitutional: She is oriented to person, place, and time. She appears well-developed.  Cardiovascular: Normal rate and regular rhythm.   Respiratory: Effort normal and breath sounds normal.  GI: Soft.  Genitourinary: Vagina normal.  Neurological: She is alert and oriented to person, place, and time.  Psychiatric: She has a normal mood and affect.    Prenatal labs: ABO, Rh: O/Positive/-- (07/15 0000) Antibody: Negative (07/15 0000) Rubella: Immune (07/15 0000) RPR: Nonreactive (07/15 0000)  HBsAg:   Negative HIV: Non-reactive (07/15 0000)  GBS: Negative (12/27 0000)  First trimester screen and AFP WNL One hour GCT 66 Hgb AS  Assessment/Plan: Pt admitted in latent phase labor, may have epidural if desires.  Doula working with her and they request to walk for one hour.  Will allow since FHR category 1. Follow progress, possible AROM.   Oliver Pila 07/25/2015, 6:08 AM

## 2015-07-25 NOTE — Anesthesia Procedure Notes (Signed)

## 2015-07-25 NOTE — Progress Notes (Signed)
Patient ID: Sylvia Fuller, female   DOB: Mar 14, 1991, 25 y.o.   MRN: [id number]   Pt uncomfortable with ctx  AFVSS gen NAD  FHTs 140's, good var.  Category 1-2  Early/late decels occasionally toco Q 2-3 min  SVE 5cm at last check Getting epidural  Continue current mgmt

## 2015-07-25 NOTE — Progress Notes (Signed)
Patient ID: Sylvia Fuller, female   DOB: [birth year], 25 y.o.   MRN: [id number]  comf with epidural  AFVSS gen NAD FHTs 130-140, mod var, category 1 toco Q 2-3 min  SVE 9.5/100/+2-3  Will recheck in 30 min, likely start pushing soon

## 2015-07-25 NOTE — MAU Note (Signed)
Pt returns with worsening contractions. Denies bleeding or ROM

## 2015-07-25 NOTE — Anesthesia Preprocedure Evaluation (Signed)

## 2015-07-26 LAB — CBC
HEMATOCRIT: 37.9 % (ref 36.0–46.0)
HEMOGLOBIN: 12.7 g/dL (ref 12.0–15.0)
MCH: 28.3 pg (ref 26.0–34.0)
MCHC: 33.5 g/dL (ref 30.0–36.0)
MCV: 84.6 fL (ref 78.0–100.0)
Platelets: 205 10*3/uL (ref 150–400)
RBC: 4.48 MIL/uL (ref 3.87–5.11)
RDW: 14 % (ref 11.5–15.5)
WBC: 12.4 10*3/uL — ABNORMAL HIGH (ref 4.0–10.5)

## 2015-07-26 NOTE — Anesthesia Postprocedure Evaluation (Signed)
Anesthesia Post Note  Patient: Sylvia Fuller  Procedure(s) Performed: * No procedures listed *  Patient location during evaluation: Mother Baby Anesthesia Type: Epidural Level of consciousness: awake and alert Pain management: pain level controlled Vital Signs Assessment: post-procedure vital signs reviewed and stable Respiratory status: spontaneous breathing Cardiovascular status: blood pressure returned to baseline and stable Postop Assessment: no headache, no backache, patient able to bend at knees and no signs of nausea or vomiting Anesthetic complications: no    Last Vitals:  Filed Vitals:   07/25/15 1812 07/25/15 2153  BP: 134/78 117/50  Pulse: 94 80  Temp: 37.1 C 36.9 C  Resp: 18 20    Last Pain:  Filed Vitals:   07/26/15 0553  PainSc: 4                  Ima Hafner

## 2015-07-26 NOTE — Progress Notes (Signed)
Post Partum Day 1 Subjective: no complaints, up ad lib, voiding, tolerating PO and nl lochia, pain controlled  Objective: Blood pressure 117/50, pulse 80, temperature 98.5 F (36.9 C), temperature source Oral, resp. rate 20, height 5' 7.5" (1.715 m), weight 92.08 kg (203 lb), last menstrual period 10/22/2014, SpO2 100 %, unknown if currently breastfeeding.  Physical Exam:  General: alert and no distress Lochia: appropriate Uterine Fundus: firm   Recent Labs  07/25/15 0530 07/26/15 0520  HGB 14.8 12.7  HCT 41.6 37.9    Assessment/Plan: Plan for discharge tomorrow, Breastfeeding and Lactation consult.  Routine care.     LOS: 1 day   Bovard-Stuckert, Lygia Olaes 07/26/2015, 7:31 AM

## 2015-07-26 NOTE — Progress Notes (Signed)
UR chart review completed.  

## 2015-07-27 MED ORDER — IBUPROFEN 800 MG PO TABS: 800.0000 mg | ORAL_TABLET | Freq: Three times a day (TID) | ORAL | Status: DC | PRN

## 2015-07-27 NOTE — Progress Notes (Signed)
Post Partum Day 1 Subjective: no complaints, up ad lib, voiding, tolerating PO and normal lochia, pain controlled  Objective: Blood pressure 97/53, pulse 72, temperature 98.2 F (36.8 C), temperature source Oral, resp. rate 18, height 5' 7.5" (1.715 m), weight 92.08 kg (203 lb), last menstrual period 10/22/2014, SpO2 99 %, unknown if currently breastfeeding.  Physical Exam:  General: alert and no distress Lochia: appropriate Uterine Fundus: firm   Recent Labs  07/25/15 0530 07/26/15 0520  HGB 14.8 12.7  HCT 41.6 37.9    Assessment/Plan: Discharge home, Breastfeeding and Lactation consult.  Routine care.  D/c with motrin and PNV, f/u 6wks   LOS: 2 days   Bovard-Stuckert, Rodina Pinales 07/27/2015, 6:33 AM

## 2015-07-27 NOTE — Progress Notes (Signed)
Discharge teaching complete with pt. Mom understood all information and did not have any questions. Pt discharged home to family.

## 2015-07-27 NOTE — Discharge Summary (Signed)
OB Discharge Summary     Patient Name: Sylvia Fuller DOB: Dec 27, 1990 MRN: [id number]  Date of admission: 07/25/2015 Delivering MD: Sherian Rein   Date of discharge: 07/27/2015  Admitting diagnosis: 39 WEEKS CTX WORSE CLOSER Intrauterine pregnancy: [redacted]w[redacted]d     Secondary diagnosis:  Principal Problem:   SVD (spontaneous vaginal delivery) Active Problems:   Normal labor  Additional problems: N/A     Discharge diagnosis: Term Pregnancy Delivered                                                                                                Post partum procedures: N/A  Augmentation: AROM  Complications: None  Hospital course:  Onset of Labor With Vaginal Delivery     25 y.o. yo G1P1001 at [redacted]w[redacted]d was admitted in Latent Labor on 07/25/2015. Patient had an uncomplicated labor course as follows:  Membrane Rupture Time/Date: 1:42 PM ,07/25/2015   Intrapartum Procedures: Episiotomy: None [1]                                         Lacerations:  Labial [10]  Patient had a delivery of a Viable infant. 07/25/2015  Information for the patient's newborn:  Jolee, Critcher [[id number]]  Delivery Method: Vaginal, Spontaneous Delivery (Filed from Delivery Summary)    Pateint had an uncomplicated postpartum course.  She is ambulating, tolerating a regular diet, passing flatus, and urinating well. Patient is discharged home in stable condition on 07/27/2015.    Physical exam  Filed Vitals:   07/25/15 1812 07/25/15 2153 07/26/15 1800 07/27/15 0529  BP: 134/78 117/50 93/55 97/53   Pulse: 94 80 77 72  Temp: 98.7 F (37.1 C) 98.5 F (36.9 C) 98.1 F (36.7 C) 98.2 F (36.8 C)  TempSrc: Oral Oral Oral Oral  Resp: Height:      Weight:      SpO2: 100%   99%   General: alert and no distress Lochia: appropriate Uterine Fundus: firm  Labs: Lab Results  Component Value Date   WBC 12.4* 07/26/2015   HGB 12.7 07/26/2015   HCT 37.9 07/26/2015   MCV 84.6 07/26/2015   PLT 205 07/26/2015   CMP Latest Ref Rng 02/16/2015  Glucose 65 - 99 mg/dL 92  BUN 6 - 20 mg/dL 8  Creatinine 7.82 - 9.56 mg/dL 2.13  Sodium 086 - 578 mmol/L 137  Potassium 3.5 - 5.1 mmol/L 3.4(L)  Chloride 101 - 111 mmol/L 102  CO2 22 - 32 mmol/L 26  Calcium 8.9 - 10.3 mg/dL 9.1  Total Protein 6.5 - 8.1 g/dL 7.2  Total Bilirubin 0.3 - 1.2 mg/dL 0.7  Alkaline Phos 38 - 126 U/L 53  AST 15 - 41 U/L 18  ALT 14 - 54 U/L 12(L)    Discharge instruction: per After Visit Summary and "Baby and Me Booklet".  After visit meds:    Medication List    TAKE these medications        flintstones complete 60  MG chewable tablet  Chew 1 tablet by mouth daily.     ibuprofen 800 MG tablet  Commonly known as:  ADVIL,MOTRIN  Take 1 tablet (800 mg total) by mouth every 8 (eight) hours as needed.     ranitidine 150 MG tablet  Commonly known as:  ZANTAC  Take 1 tablet (150 mg total) by mouth 2 (two) times daily.        Diet: routine diet  Activity: Advance as tolerated. Pelvic rest for 6 weeks.   Outpatient follow up:6 weeks Follow up Appt:No future appointments. Follow up Visit:No Follow-up on file.  Postpartum contraception: Nexplanon  Newborn Data: Live born female  Birth Weight: 7 lb 0.5 oz (3190 g) APGAR: 9, 9  Baby Feeding: Bottle was trying breast Disposition:home with mother   07/27/2015 Sherian Rein, MD

## 2015-07-27 NOTE — Clinical Social Work Maternal (Signed)
CLINICAL SOCIAL WORK MATERNAL/CHILD NOTE  Patient Details  Name: Stephenia Vogan MRN: [id number] Date of Birth: [birth year]  Date:  07/27/2015  Clinical Social Worker Initiating Note:  Loleta Books MSW, LCSW Date/ Time Initiated:  07/27/15/0945     Child's Name:  Lorelee New    Legal Guardian:  Mother   Need for Interpreter:  None   Date of Referral:  07/25/15     Reason for Referral:  History of anxiety   Referral Source:  [facility]   Address:  [address] [place], [place] [address]  Phone number:  [phone]   Household Members:  Significant Other   Natural Supports (not living in the home):  Extended Family, Immediate Family   Professional Supports: None   Employment:     Type of Work: Warden/ranger:      Surveyor, quantity Resources:  [employer]   Other Resources:  Sales executive , WIC   Cultural/Religious Considerations Which May Impact Care:  None reported  Strengths:  Ability to meet basic needs , Home prepared for child , Pediatrician chosen    Risk Factors/Current Problems:   1. Mental Health Concerns: MOB presents with history of anxiety and panic attacks secondary to fear of the unknown.    Cognitive State:  Able to Concentrate , Alert , Goal Oriented , Linear Thinking , Insightful    Mood/Affect:  Happy , Interested , Comfortable , Calm    CSW Assessment:  CSW received request for consult due to MOB presenting with a history of anxiety.  MOB presented in a pleasant mood, displayed a full range in affect, and was easily engaged. MOB presented as receptive to the visit with CSW, and openly discussed her thoughts and feelings as she transitions postpartum.  MOB endorsed feelings of happiness and excitement secondary to the infant's birth. She shared that she was open-minded as she approached labor and delivery, and reported that she is happy and content with her decision to receive medical interventions for pain management.  She shared that she was nervous about receiving the epidural, but felt reassured as she was provided with information about what to anticipate and expect. MOB expressed feelings of happiness and excitement secondary to discharge, and denied any current acute worries or concerns. She stated that she is not worried since she knows that she has a strong support system and that she is not alone.  MOB identified numerous members of her support system who have committed to helping her and the infant as she transitions postpartum.  MOB also reported that the home is prepared for the infant.    Per MOB, she presents with a history of anxiety and panic attacks, but denied history of a formal diagnosis of prior treatment. Per MOB, she has a fear of the unknown, and shared that she often thinks about the "what if" scenarios, and will think about and worry about the worst possible outcome.   MOB stated that she notes that if she ruminates on her worries, it can trigger a panic attack; however, she discussed how she has learned how to cope with her anxieties in order to reduce frequency of panic attacks. Per MOB, her grandmother used to help her by providing her with reassurance that she will be okay, and she stated that she know engages in positive self-talk.  She stated that she also knows that she needs to distract herself and focus on another topic or activity when she notes increase in worries.  MOB was also receptive to exploring how to examine evidence for/or against her worry and fear in order to disengage from her anxious thoughts. MOB recognized her personal history of worries and fears being false, and that in the future, she will need to slow down her thoughts, and examine the evidence, in order to assess if it is necessary to become worried.    Per MOB, despite there being many unknowns about caring for an infant, she does not feel worried or concerned. MOB recognized that this is normally a trigger for her  anxiety, but she again emphasized an awareness that she will be "okay" due to her support system and her extensive experience previously caring for children.  MOB presented as attentive and engaged as CSW provided education on the baby blues and perinatal mood disorders.  CSW reviewed signs and symptoms that warrant medical attention, and MOB agreed to follow up with her medical provider if needs arise.  MOB denied questions, concerns, or needs at this time. She expressed appreciation for the information and support, and agreed to contact CSW if additional needs arise prior to discharge.  CSW Plan/Description:   1. Patient/Family Education: Perinatal mood and anxiety disorders 2. Information/Referral to [facility]: Feelings After Birth support group 3. No Further Intervention Required/No Barriers to Discharge    Kelby Fam 07/27/2015, 10:40 AM

## 2015-10-10 ENCOUNTER — Encounter (HOSPITAL_COMMUNITY): Payer: Self-pay | Admitting: Emergency Medicine

## 2015-10-10 ENCOUNTER — Ambulatory Visit (HOSPITAL_COMMUNITY)
Admission: EM | Admit: 2015-10-10 | Discharge: 2015-10-10 | Disposition: A | Discharge status: Home / Self Care | Payer: Medicaid Other | Attending: Emergency Medicine | Admitting: Emergency Medicine

## 2015-10-10 DIAGNOSIS — L03312 Cellulitis of back [any part except buttock]: Secondary | ICD-10-CM

## 2015-10-10 DIAGNOSIS — T148 Other injury of unspecified body region: Secondary | ICD-10-CM

## 2015-10-10 DIAGNOSIS — W57XXXA Bitten or stung by nonvenomous insect and other nonvenomous arthropods, initial encounter: Secondary | ICD-10-CM

## 2015-10-10 MED ORDER — CEPHALEXIN 500 MG PO CAPS: 500.0000 mg | ORAL_CAPSULE | Freq: Four times a day (QID) | ORAL | Status: DC

## 2015-10-10 NOTE — Discharge Instructions (Signed)
It looks like something that you. I'm not sure what. It has either become infected or you are having a significant local reaction. Take Keflex 4 times a day for 7 days to cover infection. Take an over-the-counter allergy medicine such as Benadryl, Claritin, or Zyrtec to help with the itching. Apply ice several times a day to help with the swelling. You should see improvement over the next 2 days. Follow-up as needed.

## 2015-10-10 NOTE — ED Provider Notes (Signed)
CSN: [id number]     Arrival date & time 10/10/15  1541 History   First MD Initiated Contact with Patient 10/10/15 1717     Chief Complaint  Patient presents with  . Insect Bite   (Consider location/radiation/quality/duration/timing/severity/associated sxs/prior Treatment) HPI  She is a [age] year old woman here for evaluation of insect bite. She states that yesterday evening she developed an itch on her left lower back. When she scratched at it she noticed some bumps and swelling. She does not remember a specific bite or sting. It has gotten more swollen and itchy. It is now a little tender.  Past Medical History  Diagnosis Date  . Medical history non-contributory   . Anxiety   . SVD (spontaneous vaginal delivery) 07/25/2015   Past Surgical History  Procedure Laterality Date  . No past surgeries    . Cleft palate repair     Family History  Problem Relation Age of Onset  . Hypertension Maternal Grandfather    Social History  Substance Use Topics  . Smoking status: Never Smoker   . Smokeless tobacco: Never Used  . Alcohol Use: No     Comment: occasional   OB History    Gravida Para Term Preterm AB TAB SAB Ectopic Multiple Living   1 1 1  0 0 0 0 0 0 1     Review of Systems As in history of present illness Allergies  Review of patient's allergies indicates no known allergies.  Home Medications   Prior to Admission medications   Medication Sig Start Date End Date Taking? Authorizing Provider  cephALEXin (KEFLEX) 500 MG capsule Take 1 capsule (500 mg total) by mouth 4 (four) times daily. 10/10/15   Charm RingsErin J Latanya Hemmer, MD  flintstones complete (FLINTSTONES) 60 MG chewable tablet Chew 1 tablet by mouth daily.    Historical Provider, MD  ibuprofen (ADVIL,MOTRIN) 800 MG tablet Take 1 tablet (800 mg total) by mouth every 8 (eight) hours as needed. 07/27/15   Sherian ReinJody Bovard-Stuckert, MD   Meds Ordered and Administered this Visit  Medications - No data to display  BP 123/68 mmHg  Pulse 61   Temp(Src) 98.5 F (36.9 C) (Oral)  Resp 18  SpO2 100%  LMP 09/19/2015 (Approximate)  Breastfeeding? No No data found.   Physical Exam  Constitutional: She is oriented to person, place, and time. She appears well-developed and well-nourished. No distress.  Cardiovascular: Normal rate.   Pulmonary/Chest: Effort normal.  Musculoskeletal:       Back:  Neurological: She is alert and oriented to person, place, and time.    ED Course  Procedures (including critical care time)  Labs Review Labs Reviewed - No data to display  Imaging Review No results found.   MDM   1. Insect bite   2. Cellulitis of back except buttock    Local reaction versus cellulitis. We'll cover with Keflex. Also recommended OTC allergy medication and ice. Follow-up if not improving in 2 days.    Charm RingsErin J Islam Eichinger, MD 10/10/15 [phone]

## 2015-10-10 NOTE — ED Notes (Signed)
[facility] a [facility] of a possible insect bite on the left side of her lower back that occurred yesterday.

## 2016-03-07 ENCOUNTER — Inpatient Hospital Stay (HOSPITAL_COMMUNITY)
Admission: AD | Admit: 2016-03-07 | Discharge: 2016-03-07 | Disposition: A | Discharge status: Home / Self Care | Payer: Medicaid Other | Source: Ambulatory Visit | Attending: Obstetrics and Gynecology | Admitting: Obstetrics and Gynecology

## 2016-03-07 ENCOUNTER — Encounter (HOSPITAL_COMMUNITY): Payer: Self-pay | Admitting: *Deleted

## 2016-03-07 DIAGNOSIS — R220 Localized swelling, mass and lump, head: Secondary | ICD-10-CM | POA: Insufficient documentation

## 2016-03-07 DIAGNOSIS — F419 Anxiety disorder, unspecified: Secondary | ICD-10-CM | POA: Insufficient documentation

## 2016-03-07 DIAGNOSIS — Z79899 Other long term (current) drug therapy: Secondary | ICD-10-CM | POA: Insufficient documentation

## 2016-03-07 NOTE — MAU Provider Note (Signed)
History     CSN: [id number]  Arrival date and time: 03/07/16 1254   First Provider Initiated Contact with Patient 03/07/16 1347      Chief Complaint  Patient presents with  . Oral Swelling   HPI Sylvia Fuller is a 25 y.o. female who presents for oral/facial swelling. This is a recurring problem that has happened several times in the last year. Denies any history of medication/food/latex allergies. Denies any new medications or changes in diet. Denies SOB or difficulty swallowing. Swelling left lip started yesterday. Denies pain or itching. Does not have a PCP.   OB History    Gravida Para Term Preterm AB Living   1 1 1  0 0 1   SAB TAB Ectopic Multiple Live Births   0 0 0 0 1      Past Medical History:  Diagnosis Date  . Anxiety   . Medical history non-contributory   . SVD (spontaneous vaginal delivery) 07/25/2015    Past Surgical History:  Procedure Laterality Date  . CLEFT PALATE REPAIR    . NO PAST SURGERIES      Family History  Problem Relation Age of Onset  . Hypertension Maternal Grandfather     Social History  Substance Use Topics  . Smoking status: Never Smoker  . Smokeless tobacco: Never Used  . Alcohol use No     Comment: occasional    Allergies: No Known Allergies  Prescriptions Prior to Admission  Medication Sig Dispense Refill Last Dose  . cephALEXin (KEFLEX) 500 MG capsule Take 1 capsule (500 mg total) by mouth 4 (four) times daily. (Patient not taking: Reported on 03/07/2016) 28 capsule 0 Not Taking at Unknown time  . ibuprofen (ADVIL,MOTRIN) 800 MG tablet Take 1 tablet (800 mg total) by mouth every 8 (eight) hours as needed. (Patient not taking: Reported on 03/07/2016) 45 tablet 1 Not Taking at Unknown time    Review of Systems  Constitutional: Negative for chills and fever.  HENT: Negative for congestion and sore throat.   Respiratory: Negative for shortness of breath.   Skin: Negative for itching and rash.  Neurological: Negative for dizziness,  tingling and headaches.  Endo/Heme/Allergies: Negative for environmental allergies.   Physical Exam   Blood pressure 123/64, pulse 61, temperature 98.4 F (36.9 C), temperature source Oral, resp. rate 16, last menstrual period 03/04/2016, SpO2 100 %, not currently breastfeeding.  Physical Exam  Nursing note and vitals reviewed. Constitutional: She is oriented to person, place, and time. She appears well-developed and well-nourished. No distress.  HENT:  Head: Normocephalic and atraumatic.  Mouth/Throat: No oropharyngeal exudate or posterior oropharyngeal edema.  Mild swelling; left side of upper & lower lips. No discoloration or rash.   Eyes: Conjunctivae are normal. Right eye exhibits no discharge. Left eye exhibits no discharge. No scleral icterus.  Neck: Normal range of motion.  Cardiovascular: Normal rate, regular rhythm and normal heart sounds.   No murmur heard. Respiratory: Effort normal and breath sounds normal. No respiratory distress. She has no wheezes.  Neurological: She is alert and oriented to person, place, and time.  Skin: Skin is warm and dry. She is not diaphoretic.  Psychiatric: She has a normal mood and affect. Her behavior is normal. Judgment and thought content normal.    MAU Course  Procedures  MDM VSS NAD  Assessment and Plan  A: 1. Facial swelling     P: Discharge home Start care with a PCP ASAP Go to urgent care if symptoms don't improve  Go to ED if symptoms worsen or are associated with SOB, fever, or difficulty swallowing  Judeth Horn 03/07/2016, 1:47 PM

## 2016-03-07 NOTE — Discharge Instructions (Signed)
Establish care with a primary care provider ASAP.  Go to urgent care if symptoms don't improve.  Go to emergency department if symptoms worsen or are associated with shortness of breath or difficulty swallowing.

## 2016-03-07 NOTE — MAU Note (Addendum)
Noted her lower lip was swollen a few days ago.  This morning when she woke up, her lower lip was still swollen, corner of upper lip and left side of face was swollen.  Not the first time this has happened.  Has not taken any  Benadryl.  Denies any resp problems or difficulty., no swelling in throat

## 2016-04-13 ENCOUNTER — Encounter (HOSPITAL_COMMUNITY): Payer: Self-pay | Admitting: Emergency Medicine

## 2016-04-13 ENCOUNTER — Emergency Department (HOSPITAL_COMMUNITY)
Admission: EM | Admit: 2016-04-13 | Discharge: 2016-04-13 | Disposition: A | Discharge status: Home / Self Care | Payer: Medicaid Other | Attending: Emergency Medicine | Admitting: Emergency Medicine

## 2016-04-13 DIAGNOSIS — Z9109 Other allergy status, other than to drugs and biological substances: Secondary | ICD-10-CM

## 2016-04-13 DIAGNOSIS — J302 Other seasonal allergic rhinitis: Secondary | ICD-10-CM | POA: Insufficient documentation

## 2016-04-13 HISTORY — DX: Other seasonal allergic rhinitis: J30.2

## 2016-04-13 MED ORDER — FLUTICASONE PROPIONATE 50 MCG/ACT NA SUSP: 2.0000 | Freq: Every day | NASAL | 2 refills | Status: AC

## 2016-04-13 NOTE — ED Notes (Signed)
See providers assessment.  

## 2016-04-13 NOTE — ED Triage Notes (Addendum)
C/o bilateral ear pain, sore throat, and nasal congestion x 2 days.  States her allergies were bothering her and she hiked in the woods and now they are worse.  Took Zyrtec without relief.

## 2016-04-13 NOTE — ED Provider Notes (Signed)
[facility]-EMERGENCY DEPT Provider Note   CSN: [id number] Arrival date & time: 04/13/16  2240  By signing my name below, I, Placido Sou, attest that this documentation has been prepared under the direction and in the presence of [employer], PA-C. Electronically Signed: Placido Sou, ED Scribe. 04/13/16. 11:23 PM.   History   Chief Complaint Chief Complaint  Patient presents with  . Otalgia  . Allergies  . Sore Throat    HPI HPI Comments: Sylvia Fuller is a 25 y.o. female who presents to the Emergency Department complaining of constant, mild, bilateral ear pain x 1 day. She reports associated nasal congestion, rhinorrhea, post nasal drip and sore throat. Pt reports a h/o seasonal allergies and notes that she went hiking in the woods yesterday prior to the onset of her current symptoms. She took benadryl and zyrtec w/o significant relief. Pt states her child has also been coughing and sneezing. She denies wheezing, coughing or other associated symptoms at this time.   The history is provided by the patient and medical records. No language interpreter was used.   Past Medical History:  Diagnosis Date  . Anxiety   . Medical history non-contributory   . Seasonal allergies   . SVD (spontaneous vaginal delivery) 07/25/2015    Patient Active Problem List   Diagnosis Date Noted  . Normal labor 07/25/2015  . SVD (spontaneous vaginal delivery) 07/25/2015    Past Surgical History:  Procedure Laterality Date  . CLEFT PALATE REPAIR    . NO PAST SURGERIES      OB History    Gravida Para Term Preterm AB Living   1 1 1  0 0 1   SAB TAB Ectopic Multiple Live Births   0 0 0 0 1       Home Medications    Prior to Admission medications   Medication Sig Start Date End Date Taking? Authorizing Provider  fluticasone (FLONASE) 50 MCG/ACT nasal spray Place 2 sprays into both nostrils daily. 04/13/16   Dahlia Client Celene Pippins, PA-C    Family History Family History  Problem  Relation Age of Onset  . Hypertension Maternal Grandfather     Social History Social History  Substance Use Topics  . Smoking status: Never Smoker  . Smokeless tobacco: Never Used  . Alcohol use No     Comment: occasional    Allergies   Review of patient's allergies indicates no known allergies.   Review of Systems Review of Systems  HENT: Positive for congestion, ear pain, postnasal drip, rhinorrhea, sneezing and sore throat.   Respiratory: Negative for cough, shortness of breath and wheezing.    Physical Exam Updated Vital Signs BP 134/79 (BP Location: Left Arm)   Pulse 74   Temp 98.7 F (37.1 C) (Oral)   Resp 18   LMP 03/30/2016   SpO2 100%   Physical Exam  Constitutional: She appears well-developed and well-nourished. No distress.  HENT:  Head: Normocephalic and atraumatic.  Right Ear: Tympanic membrane, external ear and ear canal normal.  Left Ear: Tympanic membrane, external ear and ear canal normal.  Nose: Mucosal edema and rhinorrhea present. No epistaxis. Right sinus exhibits no maxillary sinus tenderness and no frontal sinus tenderness. Left sinus exhibits no maxillary sinus tenderness and no frontal sinus tenderness.  Mouth/Throat: Uvula is midline and mucous membranes are normal. Mucous membranes are not pale and not cyanotic. No oropharyngeal exudate, posterior oropharyngeal edema, posterior oropharyngeal erythema or tonsillar abscesses.  Eyes: Conjunctivae are normal. Pupils are equal, round, and  reactive to light.  Neck: Normal range of motion and full passive range of motion without pain.  Cardiovascular: Normal rate and intact distal pulses.   Pulmonary/Chest: Effort normal and breath sounds normal. No stridor.  Clear and equal breath sounds without focal wheezes, rhonchi, rales  Abdominal: Soft. There is no tenderness.  Musculoskeletal: Normal range of motion.  Lymphadenopathy:    She has no cervical adenopathy.  Neurological: She is alert.  Skin:  Skin is warm and dry. No rash noted. She is not diaphoretic.  Psychiatric: She has a normal mood and affect.  Nursing note and vitals reviewed.  ED Treatments / Results   Procedures Procedures  DIAGNOSTIC STUDIES: Oxygen Saturation is 100% on RA, normal by my interpretation.    COORDINATION OF CARE: 11:22 PM Discussed next steps with pt. Pt verbalized understanding and is agreeable with the plan.    Medications Ordered in ED Medications - No data to display   Initial Impression / Assessment and Plan / ED Course  I have reviewed the triage vital signs and the nursing notes.  Pertinent labs & imaging results that were available during my care of the patient were reviewed by me and considered in my medical decision making (see chart for details).  Clinical Course   Patient with seasonal and environmental allergies. She is already taking Zyrtec and Benadryl. Suggest that she continue these. Will add Flonase. No signs of infection.  I personally performed the services described in this documentation, which was scribed in my presence. The recorded information has been reviewed and is accurate.  Final Clinical Impressions(s) / ED Diagnoses   Final diagnoses:  Environmental allergies  Acute seasonal allergic rhinitis due to other allergen    New Prescriptions Discharge Medication List as of 04/13/2016 11:28 PM    START taking these medications   Details  fluticasone (FLONASE) 50 MCG/ACT nasal spray Place 2 sprays into both nostrils daily., Starting Fri 04/13/2016, Print         Dahlia ClientHannah Feleica Fulmore, PA-C 04/14/16 0110    April Palumbo, MD 04/22/16 2322

## 2016-04-13 NOTE — Discharge Instructions (Signed)
1. Medications: flonase, usual home medications 2. Treatment: rest, drink plenty of fluids, take medications as prescribed 3. Follow Up: Please followup with your primary doctor in 3 days for discussion of your diagnoses and further evaluation after today's visit; if you do not have a primary care doctor use the resource guide provided to find one; followup with dermatology as needed; Return to the ER for difficulty breathing, return of allergic reaction or other concerning symptoms

## 2016-04-13 NOTE — ED Notes (Signed)
Pt stable, understands discharge instructions, and reasons for return.   

## 2016-04-21 IMAGING — US US ABDOMEN LIMITED
1 series · 14 of 25 positions shown · non-contrast
Comparison: None.

CLINICAL DATA: Upper abdominal pain.  Second trimester pregnancy.

EXAM:
US ABDOMEN LIMITED - RIGHT UPPER QUADRANT

[Series 1: us abdomen limited · 14 of 40 slices shown]
[im 1/40]
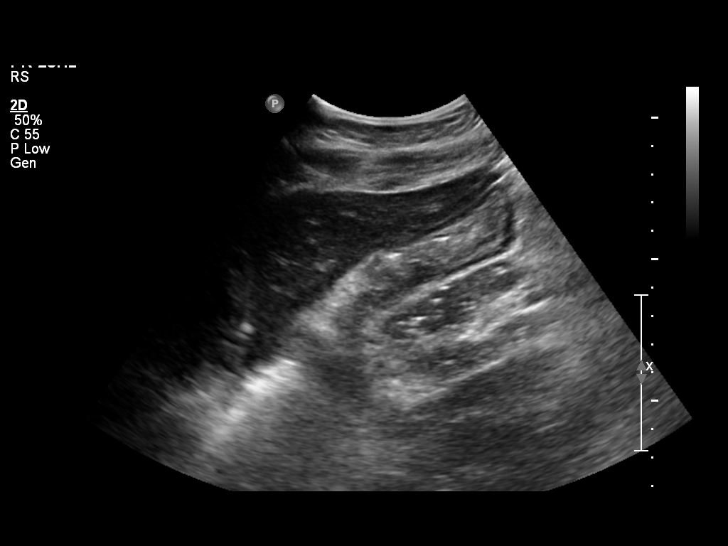
[im 4/40]
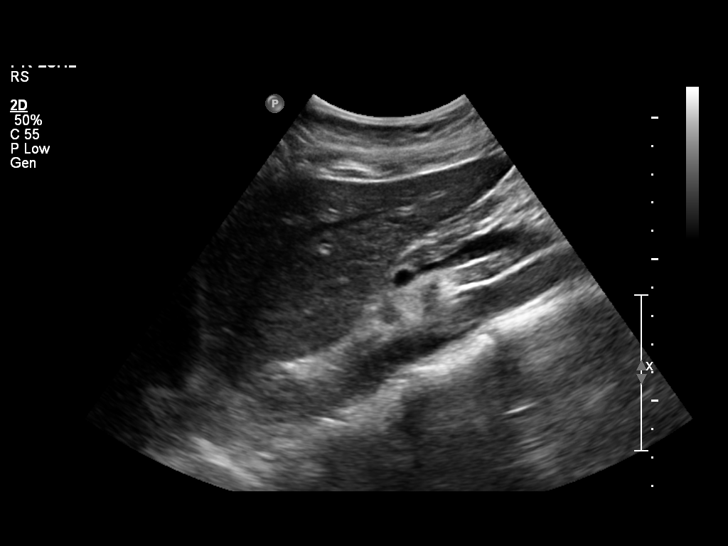
[im 7/40]
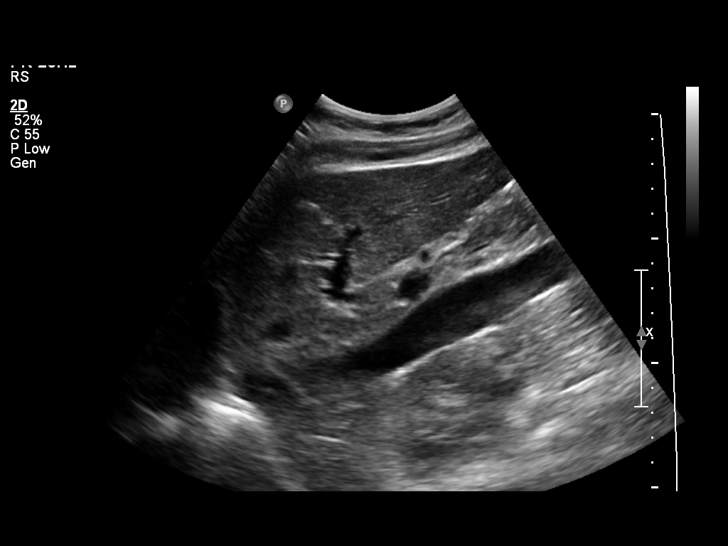
[im 10/40]
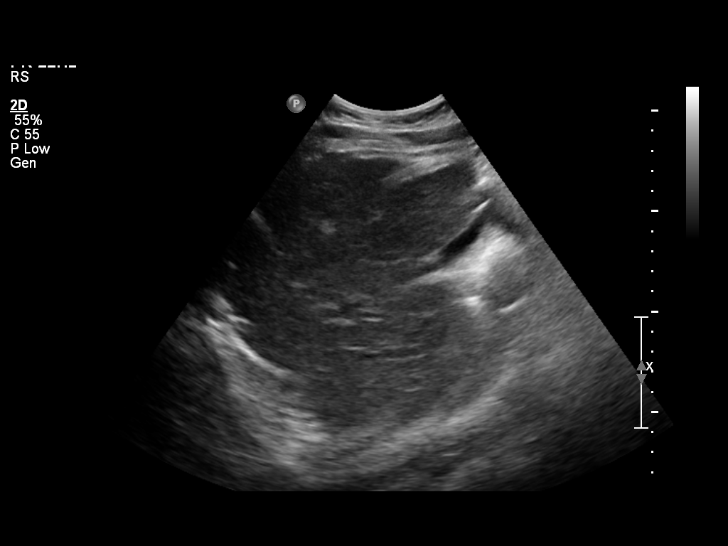
[im 14/40]
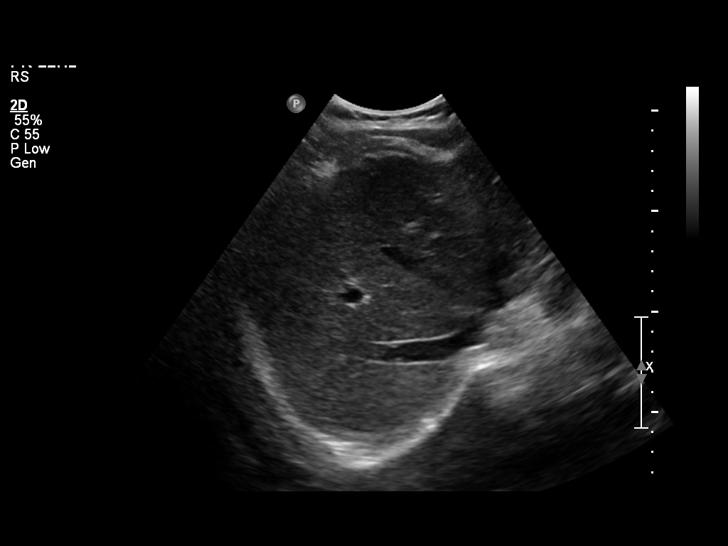
[im 15/40]
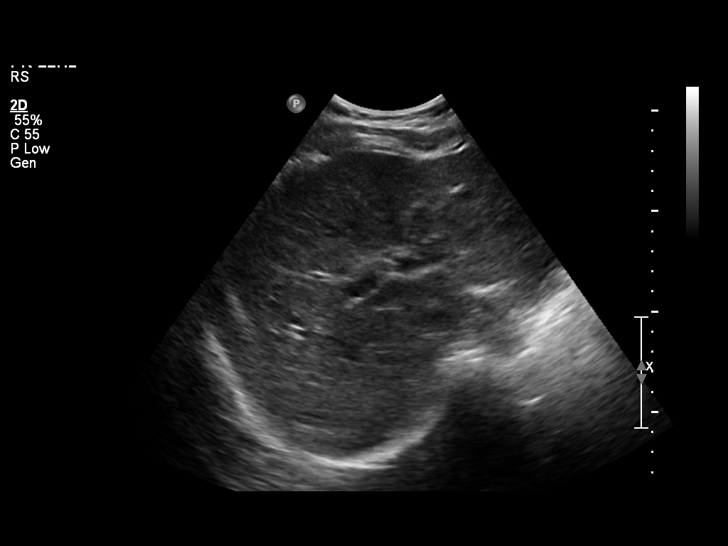
[im 18/40]
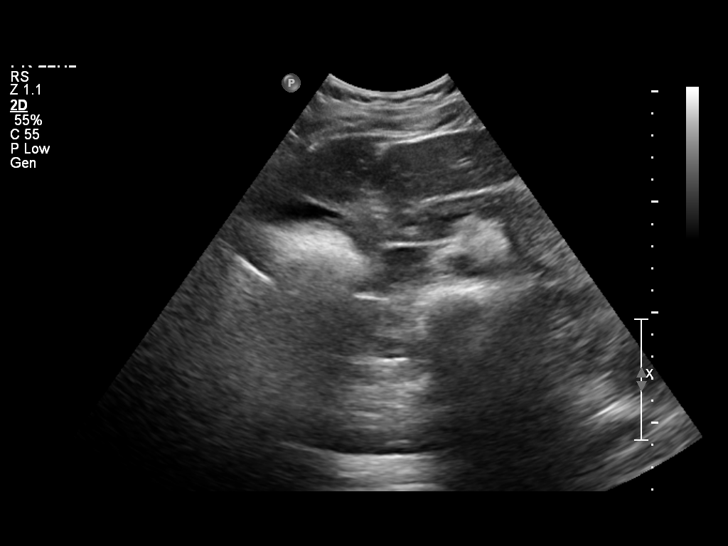
[im 22/40]
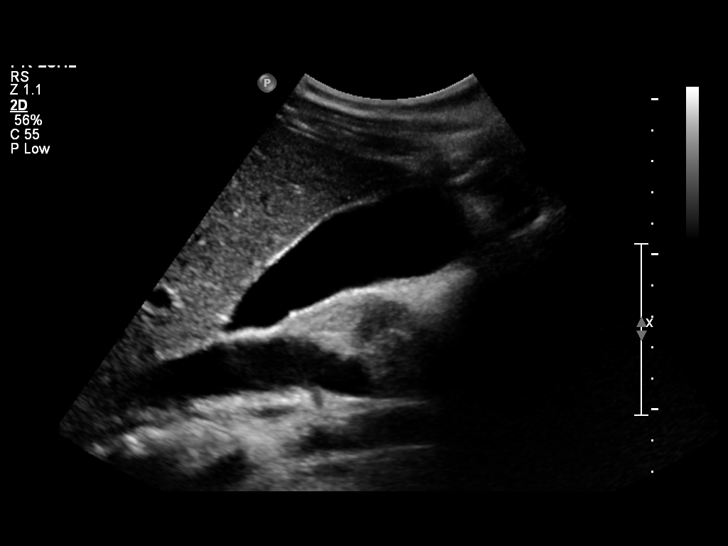
[im 25/40]
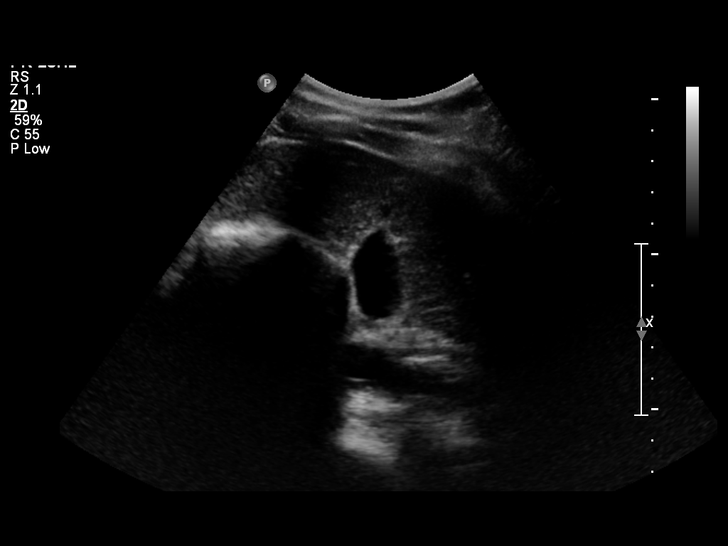
[im 27/40]
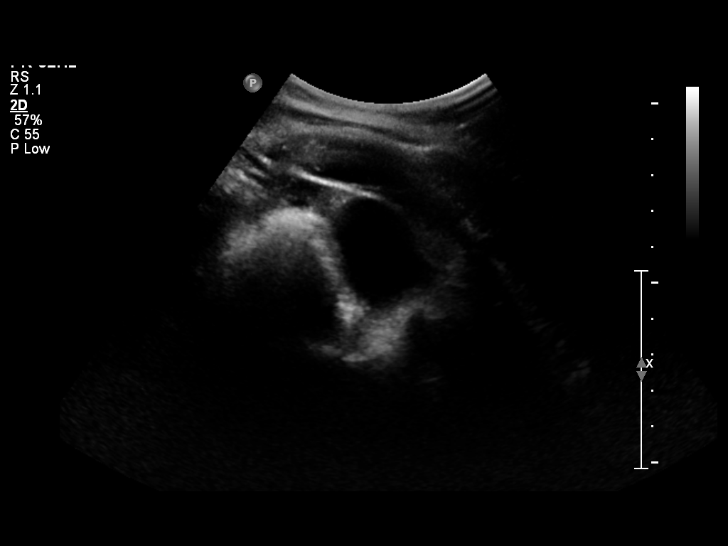
[im 30/40]
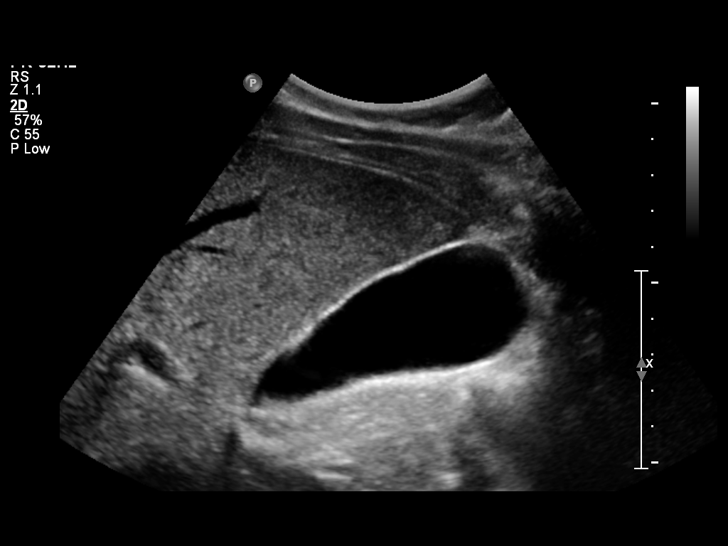
[im 33/40]
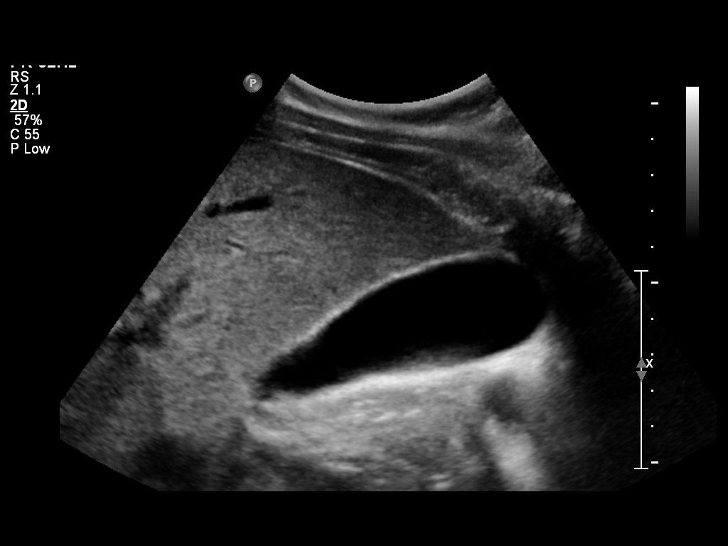
[im 36/40]
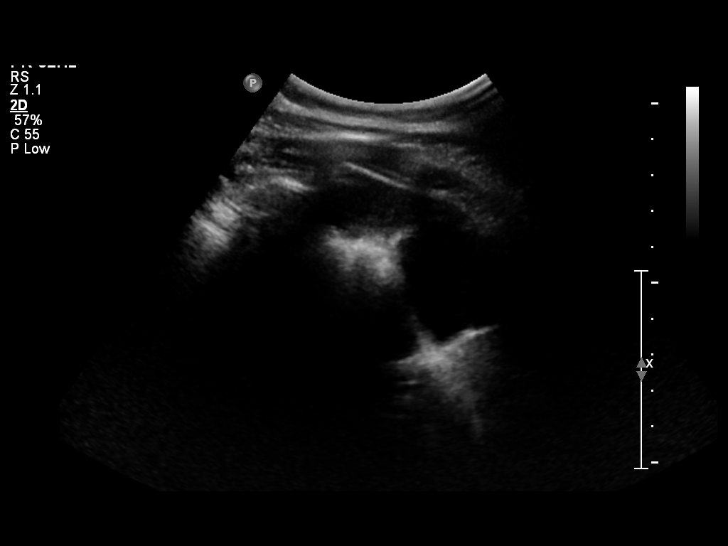
[im 40/40]
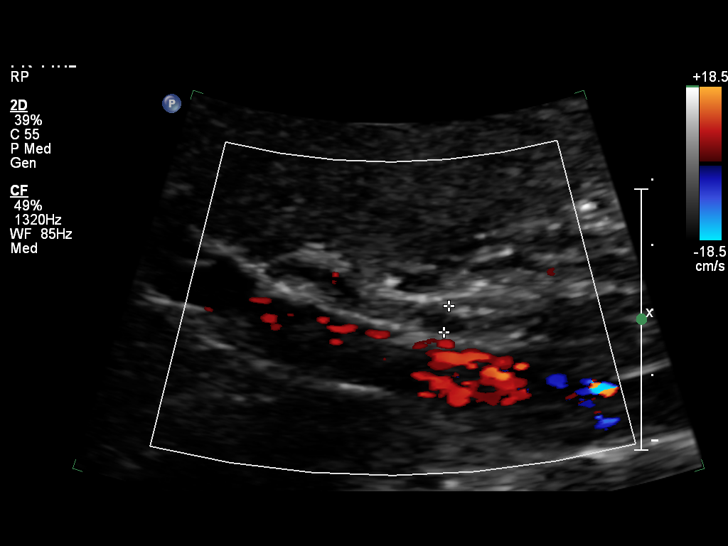

[14 of 25 positions shown; findings below may reference images not displayed]

FINDINGS: Gallbladder:

No gallstones or wall thickening visualized. No sonographic Murphy
sign noted.

Common bile duct:

Diameter: 4 mm

Liver:

No focal lesion identified. Within normal limits in parenchymal
echogenicity.
IMPRESSION: Normal abdominal ultrasound.
